# Patient Record
Sex: Female | Born: 1935 | Race: White | Hispanic: No | Marital: Married | Smoking: Former smoker
Health system: Southern US, Community
[De-identification: ages and names within clinical notes are randomized; demographics above are authoritative.]

## PROBLEM LIST (undated history)

## (undated) DIAGNOSIS — K219 Gastro-esophageal reflux disease without esophagitis: Secondary | ICD-10-CM

## (undated) DIAGNOSIS — M1990 Unspecified osteoarthritis, unspecified site: Secondary | ICD-10-CM

## (undated) DIAGNOSIS — E7800 Pure hypercholesterolemia, unspecified: Secondary | ICD-10-CM

## (undated) DIAGNOSIS — I10 Essential (primary) hypertension: Secondary | ICD-10-CM

## (undated) DIAGNOSIS — K5790 Diverticulosis of intestine, part unspecified, without perforation or abscess without bleeding: Secondary | ICD-10-CM

## (undated) DIAGNOSIS — E538 Deficiency of other specified B group vitamins: Secondary | ICD-10-CM

## (undated) DIAGNOSIS — M810 Age-related osteoporosis without current pathological fracture: Secondary | ICD-10-CM

## (undated) DIAGNOSIS — K589 Irritable bowel syndrome without diarrhea: Secondary | ICD-10-CM

## (undated) DIAGNOSIS — J189 Pneumonia, unspecified organism: Secondary | ICD-10-CM

## (undated) DIAGNOSIS — K635 Polyp of colon: Secondary | ICD-10-CM

## (undated) DIAGNOSIS — Z86010 Personal history of colonic polyps: Secondary | ICD-10-CM

## (undated) HISTORY — DX: Unspecified osteoarthritis, unspecified site: M19.90

## (undated) HISTORY — PX: CATARACT EXTRACTION, BILATERAL: SHX1313

## (undated) HISTORY — DX: Polyp of colon: K63.5

## (undated) HISTORY — PX: COLONOSCOPY WITH ESOPHAGOGASTRODUODENOSCOPY (EGD): SHX5779

## (undated) HISTORY — DX: Pneumonia, unspecified organism: J18.9

## (undated) HISTORY — DX: Diverticulosis of intestine, part unspecified, without perforation or abscess without bleeding: K57.90

## (undated) HISTORY — DX: Personal history of colonic polyps: Z86.010

## (undated) HISTORY — DX: Deficiency of other specified B group vitamins: E53.8

## (undated) HISTORY — PX: PARATHYROIDECTOMY: SHX19

---
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
## 1988-04-18 HISTORY — PX: NASAL SINUS SURGERY: SHX719

## 2013-04-18 HISTORY — PX: FEMUR FRACTURE SURGERY: SHX633

## 2014-01-16 HISTORY — PX: ESOPHAGOGASTRODUODENOSCOPY: SHX1529

## 2014-06-18 DIAGNOSIS — R1013 Epigastric pain: Secondary | ICD-10-CM | POA: Diagnosis not present

## 2014-06-18 DIAGNOSIS — K5900 Constipation, unspecified: Secondary | ICD-10-CM | POA: Diagnosis not present

## 2014-06-18 DIAGNOSIS — R1030 Lower abdominal pain, unspecified: Secondary | ICD-10-CM | POA: Diagnosis not present

## 2014-06-18 DIAGNOSIS — R140 Abdominal distension (gaseous): Secondary | ICD-10-CM | POA: Diagnosis not present

## 2014-06-18 DIAGNOSIS — K219 Gastro-esophageal reflux disease without esophagitis: Secondary | ICD-10-CM | POA: Diagnosis not present

## 2014-07-16 DIAGNOSIS — R140 Abdominal distension (gaseous): Secondary | ICD-10-CM | POA: Diagnosis not present

## 2014-07-16 DIAGNOSIS — K5900 Constipation, unspecified: Secondary | ICD-10-CM | POA: Diagnosis not present

## 2014-07-16 DIAGNOSIS — K219 Gastro-esophageal reflux disease without esophagitis: Secondary | ICD-10-CM | POA: Diagnosis not present

## 2014-07-16 DIAGNOSIS — R1013 Epigastric pain: Secondary | ICD-10-CM | POA: Diagnosis not present

## 2014-07-16 DIAGNOSIS — R1030 Lower abdominal pain, unspecified: Secondary | ICD-10-CM | POA: Diagnosis not present

## 2014-07-19 DIAGNOSIS — S72141A Displaced intertrochanteric fracture of right femur, initial encounter for closed fracture: Secondary | ICD-10-CM | POA: Insufficient documentation

## 2014-07-19 DIAGNOSIS — M79604 Pain in right leg: Secondary | ICD-10-CM | POA: Diagnosis not present

## 2014-07-19 DIAGNOSIS — K560 Paralytic ileus: Secondary | ICD-10-CM | POA: Diagnosis not present

## 2014-07-19 DIAGNOSIS — I10 Essential (primary) hypertension: Secondary | ICD-10-CM | POA: Diagnosis not present

## 2014-07-19 DIAGNOSIS — M810 Age-related osteoporosis without current pathological fracture: Secondary | ICD-10-CM | POA: Diagnosis not present

## 2014-07-19 DIAGNOSIS — K913 Postprocedural intestinal obstruction: Secondary | ICD-10-CM | POA: Diagnosis not present

## 2014-07-19 DIAGNOSIS — S72141D Displaced intertrochanteric fracture of right femur, subsequent encounter for closed fracture with routine healing: Secondary | ICD-10-CM | POA: Diagnosis not present

## 2014-07-19 DIAGNOSIS — M80051D Age-related osteoporosis with current pathological fracture, right femur, subsequent encounter for fracture with routine healing: Secondary | ICD-10-CM | POA: Diagnosis not present

## 2014-07-19 DIAGNOSIS — Z7901 Long term (current) use of anticoagulants: Secondary | ICD-10-CM | POA: Diagnosis not present

## 2014-07-19 DIAGNOSIS — E780 Pure hypercholesterolemia: Secondary | ICD-10-CM | POA: Diagnosis not present

## 2014-07-19 DIAGNOSIS — Z9181 History of falling: Secondary | ICD-10-CM | POA: Diagnosis not present

## 2014-07-19 DIAGNOSIS — K567 Ileus, unspecified: Secondary | ICD-10-CM | POA: Diagnosis not present

## 2014-07-19 DIAGNOSIS — R918 Other nonspecific abnormal finding of lung field: Secondary | ICD-10-CM | POA: Diagnosis not present

## 2014-07-19 DIAGNOSIS — S72001A Fracture of unspecified part of neck of right femur, initial encounter for closed fracture: Secondary | ICD-10-CM | POA: Diagnosis not present

## 2014-07-24 DIAGNOSIS — S72141A Displaced intertrochanteric fracture of right femur, initial encounter for closed fracture: Secondary | ICD-10-CM | POA: Diagnosis not present

## 2014-07-24 DIAGNOSIS — Z760 Encounter for issue of repeat prescription: Secondary | ICD-10-CM | POA: Diagnosis not present

## 2014-07-24 DIAGNOSIS — R262 Difficulty in walking, not elsewhere classified: Secondary | ICD-10-CM | POA: Diagnosis not present

## 2014-07-24 DIAGNOSIS — E559 Vitamin D deficiency, unspecified: Secondary | ICD-10-CM | POA: Diagnosis not present

## 2014-07-24 DIAGNOSIS — E785 Hyperlipidemia, unspecified: Secondary | ICD-10-CM | POA: Diagnosis not present

## 2014-07-24 DIAGNOSIS — M810 Age-related osteoporosis without current pathological fracture: Secondary | ICD-10-CM | POA: Diagnosis not present

## 2014-07-24 DIAGNOSIS — K913 Postprocedural intestinal obstruction: Secondary | ICD-10-CM | POA: Diagnosis not present

## 2014-07-24 DIAGNOSIS — R109 Unspecified abdominal pain: Secondary | ICD-10-CM | POA: Diagnosis not present

## 2014-07-24 DIAGNOSIS — S72001A Fracture of unspecified part of neck of right femur, initial encounter for closed fracture: Secondary | ICD-10-CM | POA: Diagnosis not present

## 2014-07-24 DIAGNOSIS — M80051D Age-related osteoporosis with current pathological fracture, right femur, subsequent encounter for fracture with routine healing: Secondary | ICD-10-CM | POA: Diagnosis not present

## 2014-07-24 DIAGNOSIS — K219 Gastro-esophageal reflux disease without esophagitis: Secondary | ICD-10-CM | POA: Diagnosis not present

## 2014-07-24 DIAGNOSIS — Z9181 History of falling: Secondary | ICD-10-CM | POA: Diagnosis not present

## 2014-07-24 DIAGNOSIS — M6281 Muscle weakness (generalized): Secondary | ICD-10-CM | POA: Diagnosis not present

## 2014-07-24 DIAGNOSIS — K5900 Constipation, unspecified: Secondary | ICD-10-CM | POA: Diagnosis not present

## 2014-07-24 DIAGNOSIS — G4700 Insomnia, unspecified: Secondary | ICD-10-CM | POA: Diagnosis not present

## 2014-07-24 DIAGNOSIS — Z7901 Long term (current) use of anticoagulants: Secondary | ICD-10-CM | POA: Diagnosis not present

## 2014-07-24 DIAGNOSIS — E780 Pure hypercholesterolemia: Secondary | ICD-10-CM | POA: Diagnosis not present

## 2014-07-24 DIAGNOSIS — D558 Other anemias due to enzyme disorders: Secondary | ICD-10-CM | POA: Diagnosis not present

## 2014-07-24 DIAGNOSIS — S72141D Displaced intertrochanteric fracture of right femur, subsequent encounter for closed fracture with routine healing: Secondary | ICD-10-CM | POA: Diagnosis not present

## 2014-07-24 DIAGNOSIS — I10 Essential (primary) hypertension: Secondary | ICD-10-CM | POA: Diagnosis not present

## 2014-07-24 DIAGNOSIS — D519 Vitamin B12 deficiency anemia, unspecified: Secondary | ICD-10-CM | POA: Diagnosis not present

## 2014-07-24 DIAGNOSIS — M25559 Pain in unspecified hip: Secondary | ICD-10-CM | POA: Diagnosis not present

## 2014-07-24 DIAGNOSIS — R682 Dry mouth, unspecified: Secondary | ICD-10-CM | POA: Diagnosis not present

## 2014-07-25 DIAGNOSIS — I10 Essential (primary) hypertension: Secondary | ICD-10-CM | POA: Diagnosis not present

## 2014-07-25 DIAGNOSIS — M810 Age-related osteoporosis without current pathological fracture: Secondary | ICD-10-CM | POA: Diagnosis not present

## 2014-07-25 DIAGNOSIS — R262 Difficulty in walking, not elsewhere classified: Secondary | ICD-10-CM | POA: Diagnosis not present

## 2014-07-25 DIAGNOSIS — E785 Hyperlipidemia, unspecified: Secondary | ICD-10-CM | POA: Diagnosis not present

## 2014-07-27 DIAGNOSIS — M6281 Muscle weakness (generalized): Secondary | ICD-10-CM | POA: Diagnosis not present

## 2014-07-27 DIAGNOSIS — M25559 Pain in unspecified hip: Secondary | ICD-10-CM | POA: Diagnosis not present

## 2014-07-29 DIAGNOSIS — D519 Vitamin B12 deficiency anemia, unspecified: Secondary | ICD-10-CM | POA: Diagnosis not present

## 2014-07-29 DIAGNOSIS — E559 Vitamin D deficiency, unspecified: Secondary | ICD-10-CM | POA: Diagnosis not present

## 2014-07-30 DIAGNOSIS — R262 Difficulty in walking, not elsewhere classified: Secondary | ICD-10-CM | POA: Diagnosis not present

## 2014-07-30 DIAGNOSIS — R682 Dry mouth, unspecified: Secondary | ICD-10-CM | POA: Diagnosis not present

## 2014-08-02 DIAGNOSIS — R262 Difficulty in walking, not elsewhere classified: Secondary | ICD-10-CM | POA: Diagnosis not present

## 2014-08-02 DIAGNOSIS — G4700 Insomnia, unspecified: Secondary | ICD-10-CM | POA: Diagnosis not present

## 2014-08-07 DIAGNOSIS — E785 Hyperlipidemia, unspecified: Secondary | ICD-10-CM | POA: Diagnosis not present

## 2014-08-07 DIAGNOSIS — K219 Gastro-esophageal reflux disease without esophagitis: Secondary | ICD-10-CM | POA: Diagnosis not present

## 2014-08-07 DIAGNOSIS — D558 Other anemias due to enzyme disorders: Secondary | ICD-10-CM | POA: Diagnosis not present

## 2014-08-07 DIAGNOSIS — I10 Essential (primary) hypertension: Secondary | ICD-10-CM | POA: Diagnosis not present

## 2014-08-10 DIAGNOSIS — D558 Other anemias due to enzyme disorders: Secondary | ICD-10-CM | POA: Diagnosis not present

## 2014-08-10 DIAGNOSIS — R262 Difficulty in walking, not elsewhere classified: Secondary | ICD-10-CM | POA: Diagnosis not present

## 2014-08-14 DIAGNOSIS — S72141D Displaced intertrochanteric fracture of right femur, subsequent encounter for closed fracture with routine healing: Secondary | ICD-10-CM | POA: Diagnosis not present

## 2014-08-19 DIAGNOSIS — M810 Age-related osteoporosis without current pathological fracture: Secondary | ICD-10-CM | POA: Diagnosis not present

## 2014-08-19 DIAGNOSIS — I10 Essential (primary) hypertension: Secondary | ICD-10-CM | POA: Diagnosis not present

## 2014-08-19 DIAGNOSIS — Z760 Encounter for issue of repeat prescription: Secondary | ICD-10-CM | POA: Diagnosis not present

## 2014-08-19 DIAGNOSIS — K219 Gastro-esophageal reflux disease without esophagitis: Secondary | ICD-10-CM | POA: Diagnosis not present

## 2014-08-20 DIAGNOSIS — K5900 Constipation, unspecified: Secondary | ICD-10-CM | POA: Diagnosis not present

## 2014-08-20 DIAGNOSIS — R262 Difficulty in walking, not elsewhere classified: Secondary | ICD-10-CM | POA: Diagnosis not present

## 2014-08-27 DIAGNOSIS — D558 Other anemias due to enzyme disorders: Secondary | ICD-10-CM | POA: Diagnosis not present

## 2014-08-27 DIAGNOSIS — R262 Difficulty in walking, not elsewhere classified: Secondary | ICD-10-CM | POA: Diagnosis not present

## 2014-08-27 DIAGNOSIS — K5900 Constipation, unspecified: Secondary | ICD-10-CM | POA: Diagnosis not present

## 2014-09-08 DIAGNOSIS — S72141D Displaced intertrochanteric fracture of right femur, subsequent encounter for closed fracture with routine healing: Secondary | ICD-10-CM | POA: Diagnosis not present

## 2014-09-08 DIAGNOSIS — K219 Gastro-esophageal reflux disease without esophagitis: Secondary | ICD-10-CM | POA: Diagnosis not present

## 2014-09-08 DIAGNOSIS — I10 Essential (primary) hypertension: Secondary | ICD-10-CM | POA: Diagnosis not present

## 2014-09-08 DIAGNOSIS — M810 Age-related osteoporosis without current pathological fracture: Secondary | ICD-10-CM | POA: Diagnosis not present

## 2014-09-08 DIAGNOSIS — E780 Pure hypercholesterolemia: Secondary | ICD-10-CM | POA: Diagnosis not present

## 2014-09-08 DIAGNOSIS — K589 Irritable bowel syndrome without diarrhea: Secondary | ICD-10-CM | POA: Diagnosis not present

## 2014-09-09 DIAGNOSIS — M810 Age-related osteoporosis without current pathological fracture: Secondary | ICD-10-CM | POA: Diagnosis not present

## 2014-09-09 DIAGNOSIS — S72141D Displaced intertrochanteric fracture of right femur, subsequent encounter for closed fracture with routine healing: Secondary | ICD-10-CM | POA: Diagnosis not present

## 2014-09-09 DIAGNOSIS — I10 Essential (primary) hypertension: Secondary | ICD-10-CM | POA: Diagnosis not present

## 2014-09-09 DIAGNOSIS — E780 Pure hypercholesterolemia: Secondary | ICD-10-CM | POA: Diagnosis not present

## 2014-09-09 DIAGNOSIS — K219 Gastro-esophageal reflux disease without esophagitis: Secondary | ICD-10-CM | POA: Diagnosis not present

## 2014-09-09 DIAGNOSIS — K589 Irritable bowel syndrome without diarrhea: Secondary | ICD-10-CM | POA: Diagnosis not present

## 2014-09-10 DIAGNOSIS — I10 Essential (primary) hypertension: Secondary | ICD-10-CM | POA: Diagnosis not present

## 2014-09-10 DIAGNOSIS — K589 Irritable bowel syndrome without diarrhea: Secondary | ICD-10-CM | POA: Diagnosis not present

## 2014-09-10 DIAGNOSIS — K219 Gastro-esophageal reflux disease without esophagitis: Secondary | ICD-10-CM | POA: Diagnosis not present

## 2014-09-10 DIAGNOSIS — S72141D Displaced intertrochanteric fracture of right femur, subsequent encounter for closed fracture with routine healing: Secondary | ICD-10-CM | POA: Diagnosis not present

## 2014-09-10 DIAGNOSIS — E780 Pure hypercholesterolemia: Secondary | ICD-10-CM | POA: Diagnosis not present

## 2014-09-10 DIAGNOSIS — M810 Age-related osteoporosis without current pathological fracture: Secondary | ICD-10-CM | POA: Diagnosis not present

## 2014-09-11 DIAGNOSIS — S72141D Displaced intertrochanteric fracture of right femur, subsequent encounter for closed fracture with routine healing: Secondary | ICD-10-CM | POA: Diagnosis not present

## 2014-09-12 DIAGNOSIS — S72141D Displaced intertrochanteric fracture of right femur, subsequent encounter for closed fracture with routine healing: Secondary | ICD-10-CM | POA: Diagnosis not present

## 2014-09-12 DIAGNOSIS — K589 Irritable bowel syndrome without diarrhea: Secondary | ICD-10-CM | POA: Diagnosis not present

## 2014-09-12 DIAGNOSIS — I10 Essential (primary) hypertension: Secondary | ICD-10-CM | POA: Diagnosis not present

## 2014-09-12 DIAGNOSIS — K219 Gastro-esophageal reflux disease without esophagitis: Secondary | ICD-10-CM | POA: Diagnosis not present

## 2014-09-12 DIAGNOSIS — M810 Age-related osteoporosis without current pathological fracture: Secondary | ICD-10-CM | POA: Diagnosis not present

## 2014-09-12 DIAGNOSIS — E780 Pure hypercholesterolemia: Secondary | ICD-10-CM | POA: Diagnosis not present

## 2014-09-15 DIAGNOSIS — K589 Irritable bowel syndrome without diarrhea: Secondary | ICD-10-CM | POA: Diagnosis not present

## 2014-09-15 DIAGNOSIS — E780 Pure hypercholesterolemia: Secondary | ICD-10-CM | POA: Diagnosis not present

## 2014-09-15 DIAGNOSIS — S72141D Displaced intertrochanteric fracture of right femur, subsequent encounter for closed fracture with routine healing: Secondary | ICD-10-CM | POA: Diagnosis not present

## 2014-09-15 DIAGNOSIS — I10 Essential (primary) hypertension: Secondary | ICD-10-CM | POA: Diagnosis not present

## 2014-09-15 DIAGNOSIS — M810 Age-related osteoporosis without current pathological fracture: Secondary | ICD-10-CM | POA: Diagnosis not present

## 2014-09-15 DIAGNOSIS — K219 Gastro-esophageal reflux disease without esophagitis: Secondary | ICD-10-CM | POA: Diagnosis not present

## 2014-09-17 DIAGNOSIS — S72141D Displaced intertrochanteric fracture of right femur, subsequent encounter for closed fracture with routine healing: Secondary | ICD-10-CM | POA: Diagnosis not present

## 2014-09-17 DIAGNOSIS — M810 Age-related osteoporosis without current pathological fracture: Secondary | ICD-10-CM | POA: Diagnosis not present

## 2014-09-17 DIAGNOSIS — E780 Pure hypercholesterolemia: Secondary | ICD-10-CM | POA: Diagnosis not present

## 2014-09-17 DIAGNOSIS — K219 Gastro-esophageal reflux disease without esophagitis: Secondary | ICD-10-CM | POA: Diagnosis not present

## 2014-09-17 DIAGNOSIS — I10 Essential (primary) hypertension: Secondary | ICD-10-CM | POA: Diagnosis not present

## 2014-09-17 DIAGNOSIS — K589 Irritable bowel syndrome without diarrhea: Secondary | ICD-10-CM | POA: Diagnosis not present

## 2014-09-18 DIAGNOSIS — S72141D Displaced intertrochanteric fracture of right femur, subsequent encounter for closed fracture with routine healing: Secondary | ICD-10-CM | POA: Diagnosis not present

## 2014-09-18 DIAGNOSIS — I10 Essential (primary) hypertension: Secondary | ICD-10-CM | POA: Diagnosis not present

## 2014-09-18 DIAGNOSIS — M810 Age-related osteoporosis without current pathological fracture: Secondary | ICD-10-CM | POA: Diagnosis not present

## 2014-09-18 DIAGNOSIS — K219 Gastro-esophageal reflux disease without esophagitis: Secondary | ICD-10-CM | POA: Diagnosis not present

## 2014-09-18 DIAGNOSIS — K589 Irritable bowel syndrome without diarrhea: Secondary | ICD-10-CM | POA: Diagnosis not present

## 2014-09-18 DIAGNOSIS — E780 Pure hypercholesterolemia: Secondary | ICD-10-CM | POA: Diagnosis not present

## 2014-09-22 DIAGNOSIS — K219 Gastro-esophageal reflux disease without esophagitis: Secondary | ICD-10-CM | POA: Diagnosis not present

## 2014-09-22 DIAGNOSIS — S72141D Displaced intertrochanteric fracture of right femur, subsequent encounter for closed fracture with routine healing: Secondary | ICD-10-CM | POA: Diagnosis not present

## 2014-09-22 DIAGNOSIS — E780 Pure hypercholesterolemia: Secondary | ICD-10-CM | POA: Diagnosis not present

## 2014-09-22 DIAGNOSIS — M810 Age-related osteoporosis without current pathological fracture: Secondary | ICD-10-CM | POA: Diagnosis not present

## 2014-09-22 DIAGNOSIS — I10 Essential (primary) hypertension: Secondary | ICD-10-CM | POA: Diagnosis not present

## 2014-09-22 DIAGNOSIS — K589 Irritable bowel syndrome without diarrhea: Secondary | ICD-10-CM | POA: Diagnosis not present

## 2014-09-23 DIAGNOSIS — I10 Essential (primary) hypertension: Secondary | ICD-10-CM | POA: Diagnosis not present

## 2014-09-23 DIAGNOSIS — S72141D Displaced intertrochanteric fracture of right femur, subsequent encounter for closed fracture with routine healing: Secondary | ICD-10-CM | POA: Diagnosis not present

## 2014-09-23 DIAGNOSIS — K589 Irritable bowel syndrome without diarrhea: Secondary | ICD-10-CM | POA: Diagnosis not present

## 2014-09-23 DIAGNOSIS — K219 Gastro-esophageal reflux disease without esophagitis: Secondary | ICD-10-CM | POA: Diagnosis not present

## 2014-09-23 DIAGNOSIS — E780 Pure hypercholesterolemia: Secondary | ICD-10-CM | POA: Diagnosis not present

## 2014-09-23 DIAGNOSIS — M810 Age-related osteoporosis without current pathological fracture: Secondary | ICD-10-CM | POA: Diagnosis not present

## 2014-09-24 DIAGNOSIS — I10 Essential (primary) hypertension: Secondary | ICD-10-CM | POA: Diagnosis not present

## 2014-09-24 DIAGNOSIS — M810 Age-related osteoporosis without current pathological fracture: Secondary | ICD-10-CM | POA: Diagnosis not present

## 2014-09-24 DIAGNOSIS — K219 Gastro-esophageal reflux disease without esophagitis: Secondary | ICD-10-CM | POA: Diagnosis not present

## 2014-09-24 DIAGNOSIS — S72141D Displaced intertrochanteric fracture of right femur, subsequent encounter for closed fracture with routine healing: Secondary | ICD-10-CM | POA: Diagnosis not present

## 2014-09-24 DIAGNOSIS — K589 Irritable bowel syndrome without diarrhea: Secondary | ICD-10-CM | POA: Diagnosis not present

## 2014-09-24 DIAGNOSIS — E780 Pure hypercholesterolemia: Secondary | ICD-10-CM | POA: Diagnosis not present

## 2014-09-25 DIAGNOSIS — K589 Irritable bowel syndrome without diarrhea: Secondary | ICD-10-CM | POA: Diagnosis not present

## 2014-09-25 DIAGNOSIS — E559 Vitamin D deficiency, unspecified: Secondary | ICD-10-CM | POA: Diagnosis not present

## 2014-09-25 DIAGNOSIS — E210 Primary hyperparathyroidism: Secondary | ICD-10-CM | POA: Diagnosis not present

## 2014-09-25 DIAGNOSIS — I10 Essential (primary) hypertension: Secondary | ICD-10-CM | POA: Diagnosis not present

## 2014-09-25 DIAGNOSIS — E780 Pure hypercholesterolemia: Secondary | ICD-10-CM | POA: Diagnosis not present

## 2014-09-25 DIAGNOSIS — M810 Age-related osteoporosis without current pathological fracture: Secondary | ICD-10-CM | POA: Diagnosis not present

## 2014-09-26 DIAGNOSIS — K589 Irritable bowel syndrome without diarrhea: Secondary | ICD-10-CM | POA: Diagnosis not present

## 2014-09-26 DIAGNOSIS — I10 Essential (primary) hypertension: Secondary | ICD-10-CM | POA: Diagnosis not present

## 2014-09-26 DIAGNOSIS — E780 Pure hypercholesterolemia: Secondary | ICD-10-CM | POA: Diagnosis not present

## 2014-09-26 DIAGNOSIS — S72141D Displaced intertrochanteric fracture of right femur, subsequent encounter for closed fracture with routine healing: Secondary | ICD-10-CM | POA: Diagnosis not present

## 2014-09-26 DIAGNOSIS — M810 Age-related osteoporosis without current pathological fracture: Secondary | ICD-10-CM | POA: Diagnosis not present

## 2014-09-26 DIAGNOSIS — K219 Gastro-esophageal reflux disease without esophagitis: Secondary | ICD-10-CM | POA: Diagnosis not present

## 2014-09-29 DIAGNOSIS — E780 Pure hypercholesterolemia: Secondary | ICD-10-CM | POA: Diagnosis not present

## 2014-09-29 DIAGNOSIS — K589 Irritable bowel syndrome without diarrhea: Secondary | ICD-10-CM | POA: Diagnosis not present

## 2014-09-29 DIAGNOSIS — S72141D Displaced intertrochanteric fracture of right femur, subsequent encounter for closed fracture with routine healing: Secondary | ICD-10-CM | POA: Diagnosis not present

## 2014-09-29 DIAGNOSIS — K219 Gastro-esophageal reflux disease without esophagitis: Secondary | ICD-10-CM | POA: Diagnosis not present

## 2014-09-29 DIAGNOSIS — M810 Age-related osteoporosis without current pathological fracture: Secondary | ICD-10-CM | POA: Diagnosis not present

## 2014-09-29 DIAGNOSIS — I10 Essential (primary) hypertension: Secondary | ICD-10-CM | POA: Diagnosis not present

## 2014-10-01 DIAGNOSIS — E780 Pure hypercholesterolemia: Secondary | ICD-10-CM | POA: Diagnosis not present

## 2014-10-01 DIAGNOSIS — K589 Irritable bowel syndrome without diarrhea: Secondary | ICD-10-CM | POA: Diagnosis not present

## 2014-10-01 DIAGNOSIS — M810 Age-related osteoporosis without current pathological fracture: Secondary | ICD-10-CM | POA: Diagnosis not present

## 2014-10-01 DIAGNOSIS — K219 Gastro-esophageal reflux disease without esophagitis: Secondary | ICD-10-CM | POA: Diagnosis not present

## 2014-10-01 DIAGNOSIS — I10 Essential (primary) hypertension: Secondary | ICD-10-CM | POA: Diagnosis not present

## 2014-10-01 DIAGNOSIS — S72141D Displaced intertrochanteric fracture of right femur, subsequent encounter for closed fracture with routine healing: Secondary | ICD-10-CM | POA: Diagnosis not present

## 2014-10-02 DIAGNOSIS — K589 Irritable bowel syndrome without diarrhea: Secondary | ICD-10-CM | POA: Diagnosis not present

## 2014-10-02 DIAGNOSIS — S72141D Displaced intertrochanteric fracture of right femur, subsequent encounter for closed fracture with routine healing: Secondary | ICD-10-CM | POA: Diagnosis not present

## 2014-10-02 DIAGNOSIS — M810 Age-related osteoporosis without current pathological fracture: Secondary | ICD-10-CM | POA: Diagnosis not present

## 2014-10-02 DIAGNOSIS — I10 Essential (primary) hypertension: Secondary | ICD-10-CM | POA: Diagnosis not present

## 2014-10-02 DIAGNOSIS — E780 Pure hypercholesterolemia: Secondary | ICD-10-CM | POA: Diagnosis not present

## 2014-10-02 DIAGNOSIS — K219 Gastro-esophageal reflux disease without esophagitis: Secondary | ICD-10-CM | POA: Diagnosis not present

## 2014-10-06 DIAGNOSIS — I10 Essential (primary) hypertension: Secondary | ICD-10-CM | POA: Diagnosis not present

## 2014-10-06 DIAGNOSIS — S72141D Displaced intertrochanteric fracture of right femur, subsequent encounter for closed fracture with routine healing: Secondary | ICD-10-CM | POA: Diagnosis not present

## 2014-10-06 DIAGNOSIS — M810 Age-related osteoporosis without current pathological fracture: Secondary | ICD-10-CM | POA: Diagnosis not present

## 2014-10-06 DIAGNOSIS — E780 Pure hypercholesterolemia: Secondary | ICD-10-CM | POA: Diagnosis not present

## 2014-10-06 DIAGNOSIS — K589 Irritable bowel syndrome without diarrhea: Secondary | ICD-10-CM | POA: Diagnosis not present

## 2014-10-06 DIAGNOSIS — K219 Gastro-esophageal reflux disease without esophagitis: Secondary | ICD-10-CM | POA: Diagnosis not present

## 2014-10-08 DIAGNOSIS — M810 Age-related osteoporosis without current pathological fracture: Secondary | ICD-10-CM | POA: Diagnosis not present

## 2014-10-08 DIAGNOSIS — K589 Irritable bowel syndrome without diarrhea: Secondary | ICD-10-CM | POA: Diagnosis not present

## 2014-10-08 DIAGNOSIS — I10 Essential (primary) hypertension: Secondary | ICD-10-CM | POA: Diagnosis not present

## 2014-10-08 DIAGNOSIS — S72141D Displaced intertrochanteric fracture of right femur, subsequent encounter for closed fracture with routine healing: Secondary | ICD-10-CM | POA: Diagnosis not present

## 2014-10-08 DIAGNOSIS — E780 Pure hypercholesterolemia: Secondary | ICD-10-CM | POA: Diagnosis not present

## 2014-10-08 DIAGNOSIS — K219 Gastro-esophageal reflux disease without esophagitis: Secondary | ICD-10-CM | POA: Diagnosis not present

## 2014-10-09 DIAGNOSIS — E780 Pure hypercholesterolemia: Secondary | ICD-10-CM | POA: Diagnosis not present

## 2014-10-09 DIAGNOSIS — M810 Age-related osteoporosis without current pathological fracture: Secondary | ICD-10-CM | POA: Diagnosis not present

## 2014-10-09 DIAGNOSIS — K589 Irritable bowel syndrome without diarrhea: Secondary | ICD-10-CM | POA: Diagnosis not present

## 2014-10-09 DIAGNOSIS — S72141D Displaced intertrochanteric fracture of right femur, subsequent encounter for closed fracture with routine healing: Secondary | ICD-10-CM | POA: Diagnosis not present

## 2014-10-09 DIAGNOSIS — K219 Gastro-esophageal reflux disease without esophagitis: Secondary | ICD-10-CM | POA: Diagnosis not present

## 2014-10-09 DIAGNOSIS — I10 Essential (primary) hypertension: Secondary | ICD-10-CM | POA: Diagnosis not present

## 2014-10-14 DIAGNOSIS — M810 Age-related osteoporosis without current pathological fracture: Secondary | ICD-10-CM | POA: Diagnosis not present

## 2014-10-14 DIAGNOSIS — E780 Pure hypercholesterolemia: Secondary | ICD-10-CM | POA: Diagnosis not present

## 2014-10-14 DIAGNOSIS — I10 Essential (primary) hypertension: Secondary | ICD-10-CM | POA: Diagnosis not present

## 2014-10-14 DIAGNOSIS — S72141D Displaced intertrochanteric fracture of right femur, subsequent encounter for closed fracture with routine healing: Secondary | ICD-10-CM | POA: Diagnosis not present

## 2014-10-14 DIAGNOSIS — K219 Gastro-esophageal reflux disease without esophagitis: Secondary | ICD-10-CM | POA: Diagnosis not present

## 2014-10-14 DIAGNOSIS — K589 Irritable bowel syndrome without diarrhea: Secondary | ICD-10-CM | POA: Diagnosis not present

## 2014-10-16 DIAGNOSIS — I10 Essential (primary) hypertension: Secondary | ICD-10-CM | POA: Diagnosis not present

## 2014-10-16 DIAGNOSIS — S72141D Displaced intertrochanteric fracture of right femur, subsequent encounter for closed fracture with routine healing: Secondary | ICD-10-CM | POA: Diagnosis not present

## 2014-10-16 DIAGNOSIS — M810 Age-related osteoporosis without current pathological fracture: Secondary | ICD-10-CM | POA: Diagnosis not present

## 2014-10-16 DIAGNOSIS — E780 Pure hypercholesterolemia: Secondary | ICD-10-CM | POA: Diagnosis not present

## 2014-10-16 DIAGNOSIS — K219 Gastro-esophageal reflux disease without esophagitis: Secondary | ICD-10-CM | POA: Diagnosis not present

## 2014-10-16 DIAGNOSIS — E210 Primary hyperparathyroidism: Secondary | ICD-10-CM | POA: Diagnosis not present

## 2014-10-16 DIAGNOSIS — K589 Irritable bowel syndrome without diarrhea: Secondary | ICD-10-CM | POA: Diagnosis not present

## 2014-10-21 DIAGNOSIS — M810 Age-related osteoporosis without current pathological fracture: Secondary | ICD-10-CM | POA: Diagnosis not present

## 2014-10-21 DIAGNOSIS — I10 Essential (primary) hypertension: Secondary | ICD-10-CM | POA: Diagnosis not present

## 2014-10-21 DIAGNOSIS — K589 Irritable bowel syndrome without diarrhea: Secondary | ICD-10-CM | POA: Diagnosis not present

## 2014-10-21 DIAGNOSIS — E780 Pure hypercholesterolemia: Secondary | ICD-10-CM | POA: Diagnosis not present

## 2014-10-21 DIAGNOSIS — K219 Gastro-esophageal reflux disease without esophagitis: Secondary | ICD-10-CM | POA: Diagnosis not present

## 2014-10-21 DIAGNOSIS — S72141D Displaced intertrochanteric fracture of right femur, subsequent encounter for closed fracture with routine healing: Secondary | ICD-10-CM | POA: Diagnosis not present

## 2014-10-23 DIAGNOSIS — S72141D Displaced intertrochanteric fracture of right femur, subsequent encounter for closed fracture with routine healing: Secondary | ICD-10-CM | POA: Diagnosis not present

## 2014-10-23 DIAGNOSIS — E780 Pure hypercholesterolemia: Secondary | ICD-10-CM | POA: Diagnosis not present

## 2014-10-23 DIAGNOSIS — E559 Vitamin D deficiency, unspecified: Secondary | ICD-10-CM | POA: Diagnosis not present

## 2014-10-23 DIAGNOSIS — Z0000 Encounter for general adult medical examination without abnormal findings: Secondary | ICD-10-CM | POA: Diagnosis not present

## 2014-10-23 DIAGNOSIS — K589 Irritable bowel syndrome without diarrhea: Secondary | ICD-10-CM | POA: Diagnosis not present

## 2014-10-23 DIAGNOSIS — E210 Primary hyperparathyroidism: Secondary | ICD-10-CM | POA: Diagnosis not present

## 2014-10-23 DIAGNOSIS — Z23 Encounter for immunization: Secondary | ICD-10-CM | POA: Diagnosis not present

## 2014-10-23 DIAGNOSIS — I10 Essential (primary) hypertension: Secondary | ICD-10-CM | POA: Diagnosis not present

## 2014-10-23 DIAGNOSIS — M810 Age-related osteoporosis without current pathological fracture: Secondary | ICD-10-CM | POA: Diagnosis not present

## 2014-10-23 DIAGNOSIS — K219 Gastro-esophageal reflux disease without esophagitis: Secondary | ICD-10-CM | POA: Diagnosis not present

## 2014-11-13 DIAGNOSIS — S72141D Displaced intertrochanteric fracture of right femur, subsequent encounter for closed fracture with routine healing: Secondary | ICD-10-CM | POA: Diagnosis not present

## 2014-12-09 DIAGNOSIS — Z780 Asymptomatic menopausal state: Secondary | ICD-10-CM | POA: Diagnosis not present

## 2014-12-09 DIAGNOSIS — Z1231 Encounter for screening mammogram for malignant neoplasm of breast: Secondary | ICD-10-CM | POA: Diagnosis not present

## 2014-12-09 DIAGNOSIS — E210 Primary hyperparathyroidism: Secondary | ICD-10-CM | POA: Diagnosis not present

## 2014-12-09 DIAGNOSIS — M810 Age-related osteoporosis without current pathological fracture: Secondary | ICD-10-CM | POA: Diagnosis not present

## 2014-12-09 DIAGNOSIS — Z7983 Long term (current) use of bisphosphonates: Secondary | ICD-10-CM | POA: Diagnosis not present

## 2015-01-04 DIAGNOSIS — Z23 Encounter for immunization: Secondary | ICD-10-CM | POA: Diagnosis not present

## 2015-04-02 ENCOUNTER — Emergency Department (HOSPITAL_BASED_OUTPATIENT_CLINIC_OR_DEPARTMENT_OTHER)
Admission: EM | Admit: 2015-04-02 | Discharge: 2015-04-02 | Disposition: A | Discharge status: Home / Self Care | Payer: Medicare Other | Attending: Emergency Medicine | Admitting: Emergency Medicine

## 2015-04-02 ENCOUNTER — Encounter (HOSPITAL_BASED_OUTPATIENT_CLINIC_OR_DEPARTMENT_OTHER): Payer: Self-pay | Admitting: *Deleted

## 2015-04-02 ENCOUNTER — Emergency Department (HOSPITAL_BASED_OUTPATIENT_CLINIC_OR_DEPARTMENT_OTHER): Payer: Medicare Other

## 2015-04-02 DIAGNOSIS — K219 Gastro-esophageal reflux disease without esophagitis: Secondary | ICD-10-CM | POA: Diagnosis not present

## 2015-04-02 DIAGNOSIS — E7800 Pure hypercholesterolemia, unspecified: Secondary | ICD-10-CM | POA: Diagnosis not present

## 2015-04-02 DIAGNOSIS — K589 Irritable bowel syndrome without diarrhea: Secondary | ICD-10-CM | POA: Diagnosis not present

## 2015-04-02 DIAGNOSIS — R05 Cough: Secondary | ICD-10-CM | POA: Diagnosis not present

## 2015-04-02 DIAGNOSIS — Z79899 Other long term (current) drug therapy: Secondary | ICD-10-CM | POA: Insufficient documentation

## 2015-04-02 DIAGNOSIS — J069 Acute upper respiratory infection, unspecified: Secondary | ICD-10-CM

## 2015-04-02 DIAGNOSIS — Z7982 Long term (current) use of aspirin: Secondary | ICD-10-CM | POA: Diagnosis not present

## 2015-04-02 DIAGNOSIS — Z8739 Personal history of other diseases of the musculoskeletal system and connective tissue: Secondary | ICD-10-CM | POA: Diagnosis not present

## 2015-04-02 DIAGNOSIS — R059 Cough, unspecified: Secondary | ICD-10-CM

## 2015-04-02 DIAGNOSIS — I10 Essential (primary) hypertension: Secondary | ICD-10-CM | POA: Insufficient documentation

## 2015-04-02 DIAGNOSIS — R0602 Shortness of breath: Secondary | ICD-10-CM | POA: Diagnosis not present

## 2015-04-02 DIAGNOSIS — R6889 Other general symptoms and signs: Secondary | ICD-10-CM | POA: Diagnosis not present

## 2015-04-02 HISTORY — DX: Pure hypercholesterolemia, unspecified: E78.00

## 2015-04-02 HISTORY — DX: Age-related osteoporosis without current pathological fracture: M81.0

## 2015-04-02 HISTORY — DX: Essential (primary) hypertension: I10

## 2015-04-02 HISTORY — DX: Gastro-esophageal reflux disease without esophagitis: K21.9

## 2015-04-02 HISTORY — DX: Irritable bowel syndrome without diarrhea: K58.9

## 2015-04-02 MED ORDER — BENZONATATE 100 MG PO CAPS: 100.0000 mg | ORAL_CAPSULE | Freq: Three times a day (TID) | ORAL | Status: DC | PRN

## 2015-04-02 MED ORDER — GUAIFENESIN 100 MG/5ML PO LIQD: 100.0000 mg | ORAL | Status: DC | PRN

## 2015-04-02 NOTE — ED Provider Notes (Signed)
CSN: [id number]     Arrival date & time 04/02/15  [id number] History   First MD Initiated Contact with Patient 04/02/15 [phone]     Chief Complaint  Patient presents with  . Cough     (Consider location/radiation/quality/duration/timing/severity/associated sxs/prior Treatment) HPI  81 year old female presents with worsening cough and congestion. Started with a "head cold" one week ago. Had sinus pressure, sneezing, and nasal congestion. Over the last 3 days is having a cough with occasional clear sputum but for the most part is dry. Feels occasional shortness of breath. No real chest pain. Chronic leg swelling that is not worse than typical. Denies any prior lung problems. Has not no any fevers. Denies sore throat or ear problems. Denies a history of smoking. She now feels like the congestion is in her chest. She has been taking Cold-ease, no relief.  Past Medical History  Diagnosis Date  . GERD (gastroesophageal reflux disease)   . Hypertension   . IBS (irritable bowel syndrome)   . High cholesterol   . Osteoporosis    Past Surgical History  Procedure Laterality Date  . Hip surgery     No family history on file. Social History  Substance Use Topics  . Smoking status: Never Smoker   . Smokeless tobacco: None  . Alcohol Use: None   OB History    No data available     Review of Systems  Constitutional: Negative for fever.  HENT: Positive for congestion and sneezing. Negative for sore throat.   Respiratory: Positive for cough and shortness of breath.   Gastrointestinal: Negative for vomiting.  All other systems reviewed and are negative.     Allergies  Sulfa antibiotics  Home Medications   Prior to Admission medications   Medication Sig Start Date End Date Taking? Authorizing Provider  aspirin 81 MG tablet Take 81 mg by mouth daily.   Yes Historical Provider, MD  atenolol (TENORMIN) 50 MG tablet Take 50 mg by mouth daily.   Yes Historical Provider, MD  calcium-vitamin D  (OSCAL WITH D) 500-200 MG-UNIT tablet Take 1 tablet by mouth.   Yes Historical Provider, MD  docusate sodium (COLACE) 100 MG capsule Take 100 mg by mouth 2 (two) times daily.   Yes Historical Provider, MD  esomeprazole (NEXIUM) 20 MG capsule Take 40 mg by mouth daily at 12 noon.   Yes Historical Provider, MD  hydrochlorothiazide (HYDRODIURIL) 25 MG tablet Take 25 mg by mouth daily.   Yes Historical Provider, MD  hyoscyamine (LEVBID) 0.375 MG 12 hr tablet Take 0.375 mg by mouth 2 (two) times daily.   Yes Historical Provider, MD  polyethylene glycol (MIRALAX / GLYCOLAX) packet Take 17 g by mouth daily.   Yes Historical Provider, MD  simvastatin (ZOCOR) 40 MG tablet Take 40 mg by mouth daily.   Yes Historical Provider, MD   BP 149/99 mmHg  Pulse 88  Temp(Src) 98.9 F (37.2 C) (Oral)  Resp 18  SpO2 97% Physical Exam  Constitutional: She is oriented to person, place, and time. She appears well-developed and well-nourished.  HENT:  Head: Normocephalic and atraumatic.  Right Ear: External ear normal.  Left Ear: External ear normal.  Nose: Nose normal.  Eyes: Right eye exhibits no discharge. Left eye exhibits no discharge.  Cardiovascular: Normal rate, regular rhythm and normal heart sounds.   Pulmonary/Chest: Effort normal and breath sounds normal. She has no wheezes. She has no rales.  No increased work of breathing, speaks in clear, complete sentences.  Abdominal:  Soft. There is no tenderness.  Musculoskeletal: She exhibits no edema.  Neurological: She is alert and oriented to person, place, and time.  Skin: Skin is warm and dry.  Nursing note and vitals reviewed.   ED Course  Procedures (including critical care time) Labs Review Labs Reviewed - No data to display  Imaging Review Dg Chest 2 View  04/02/2015  CLINICAL DATA:  Cough and shortness of breath for 1 week. EXAM: CHEST  2 VIEW COMPARISON:  None. FINDINGS: Heart size is normal. Mild tortuosity of thoracic aorta noted. No  evidence of pulmonary infiltrate or edema. No evidence of pleural effusion or pneumothorax. IMPRESSION: No active cardiopulmonary disease. Electronically Signed   By: Earle Gell M.D.   On: 04/02/2015 08:18   I have personally reviewed and evaluated these images and lab results as part of my medical decision-making.   EKG Interpretation None      MDM   Final diagnoses:  Cough  Upper respiratory infection    Patient appears well here, has symptoms consistent with a viral upper respiratory infection. Is afebrile, no increased work of breathing and normal oxygen saturations. No wheezing or obvious bronchospasm or rales heard. No indication for antibiotics, will treat with cough suppressants and expectorants and discussed symptomatic care. Discussed plan to follow-up with PCP early next week and to return if any symptoms worsen.    Sherwood Gambler, MD 04/02/15 [phone]

## 2015-04-02 NOTE — ED Notes (Signed)
Cold and head congestion x 1 week. Cough with clear sputum.

## 2015-04-27 ENCOUNTER — Ambulatory Visit: Payer: Medicare Other | Admitting: Family

## 2015-06-04 ENCOUNTER — Telehealth: Payer: Self-pay | Admitting: Behavioral Health

## 2015-06-04 NOTE — Telephone Encounter (Signed)
Unable to reach patient at time of Pre-Visit Call.  Left message for patient to return call when available.    

## 2015-06-05 ENCOUNTER — Ambulatory Visit (INDEPENDENT_AMBULATORY_CARE_PROVIDER_SITE_OTHER): Payer: Medicare Other | Admitting: Family

## 2015-06-05 ENCOUNTER — Encounter: Payer: Self-pay | Admitting: Family

## 2015-06-05 VITALS — BP 130/86 | HR 73 | Temp 97.8°F | Resp 16 | Ht 66.0 in | Wt 191.2 lb

## 2015-06-05 DIAGNOSIS — K219 Gastro-esophageal reflux disease without esophagitis: Secondary | ICD-10-CM

## 2015-06-05 DIAGNOSIS — E559 Vitamin D deficiency, unspecified: Secondary | ICD-10-CM | POA: Diagnosis not present

## 2015-06-05 DIAGNOSIS — I10 Essential (primary) hypertension: Secondary | ICD-10-CM

## 2015-06-05 DIAGNOSIS — K635 Polyp of colon: Secondary | ICD-10-CM

## 2015-06-05 DIAGNOSIS — E785 Hyperlipidemia, unspecified: Secondary | ICD-10-CM

## 2015-06-05 DIAGNOSIS — K589 Irritable bowel syndrome without diarrhea: Secondary | ICD-10-CM | POA: Insufficient documentation

## 2015-06-05 DIAGNOSIS — M810 Age-related osteoporosis without current pathological fracture: Secondary | ICD-10-CM

## 2015-06-05 LAB — BASIC METABOLIC PANEL
BUN: 10 mg/dL (ref 6–23)
CO2: 30 mEq/L (ref 19–32)
Calcium: 9.4 mg/dL (ref 8.4–10.5)
Chloride: 94 mEq/L — ABNORMAL LOW (ref 96–112)
Creatinine, Ser: 0.64 mg/dL (ref 0.40–1.20)
GFR: 93.62 mL/min (ref >60.00)
Glucose, Bld: 102 mg/dL — ABNORMAL HIGH (ref 70–99)
Potassium: 3.3 mEq/L — ABNORMAL LOW (ref 3.5–5.1)
Sodium: 133 mEq/L — ABNORMAL LOW (ref 135–145)

## 2015-06-05 LAB — HEPATIC FUNCTION PANEL
ALT: 15 U/L (ref 0–35)
AST: 16 U/L (ref 0–37)
Albumin: 4.4 g/dL (ref 3.5–5.2)
Alkaline Phosphatase: 69 U/L (ref 39–117)
Bilirubin, Direct: 0.1 mg/dL (ref 0.0–0.3)
Total Bilirubin: 0.7 mg/dL (ref 0.2–1.2)
Total Protein: 7.2 g/dL (ref 6.0–8.3)

## 2015-06-05 LAB — LIPID PANEL
Cholesterol: 193 mg/dL (ref 0–200)
HDL: 54.6 mg/dL (ref >39.00)
NonHDL: 138.8
Total CHOL/HDL Ratio: 4
Triglycerides: 208 mg/dL — ABNORMAL HIGH (ref 0.0–149.0)
VLDL: 41.6 mg/dL — ABNORMAL HIGH (ref 0.0–40.0)

## 2015-06-05 LAB — LDL CHOLESTEROL, DIRECT: Direct LDL: 109 mg/dL

## 2015-06-05 LAB — VITAMIN D 25 HYDROXY (VIT D DEFICIENCY, FRACTURES): VITD: 44.16 ng/mL (ref 30.00–100.00)

## 2015-06-05 NOTE — Assessment & Plan Note (Signed)
Patient reports that she does not wish to pursue additional colonoscopies at this time. I think this is reasonable given her age.

## 2015-06-05 NOTE — Patient Instructions (Signed)
Please complete lab work prior to leaving. Schedule medicare wellness visit at the front desk.  Welcome to [facility]!

## 2015-06-05 NOTE — Assessment & Plan Note (Signed)
Stable on current meds, obtain BMET

## 2015-06-05 NOTE — Assessment & Plan Note (Signed)
On statin, continue same. Obtain lipid panel.

## 2015-06-05 NOTE — Assessment & Plan Note (Signed)
- 

## 2015-06-05 NOTE — Progress Notes (Signed)
Pre visit review using our clinic review tool, if applicable. No additional management support is needed unless otherwise documented below in the visit note. 

## 2015-06-05 NOTE — Progress Notes (Signed)
Subjective:    Patient ID: Kelsey Butler, female    DOB: [birth year], 82 y.o.   MRN: [id number]  HPI  [name] is an 82 yr old female who presents today to establish care. She saw Dr. Harlene Salts at [facility].    Pmhx is significant for the following:  1) Gerd-on nexium.  Reports symptoms are well controlled.  Has tried to come off in the past by was unable to tolerate off med.   2) HTN- maintained on atenolol, hctz.   BP Readings from Last 3 Encounters:  06/05/15 130/86  04/02/15 149/99   3) IBS- uses Levbid- generally only once a day. Reports symptoms are generally well controlled. Used to take nortriptyline HS.  Has also tried linzess in the past.  Uses miralax daily for chronic constipation.  Also uses colace HS.    4) Hyperlipidemia- on simvastatin. Denies myalgia.  Reports cholesterol has been well controlled on this dose.   5) Osteoporosis-  On oscal. Had femur fracture following fall 2016.  She had hardware placed in [place] (Dr. Peggye Ley). She completed PT following her surgery.  Reports gait is steady.  She has used fosamax, evist, and forteo.  Now on Reclast due in June. This will be her 4th infusion.    6) hx of colon polyps- Had GI in [place]. Last colo 10/15.  Pt thinks no polyps.  She is being treated for IBS.    7) Reports hx of vit d defiency. Review of Systems  Constitutional: Negative for unexpected weight change.  HENT: Negative for rhinorrhea.   Eyes: Negative for visual disturbance.  Respiratory: Negative for cough.   Cardiovascular: Positive for leg swelling.  Gastrointestinal: Positive for constipation.  Musculoskeletal: Positive for arthralgias. Negative for myalgias.       Swelling L ankle always greater the right- attributes to previous hx of bad ankle sprain  Uses motrin occasionally.    Skin: Negative for rash.  Neurological: Negative for headaches.  Hematological: Negative for adenopathy.  Psychiatric/Behavioral:       Denies depression/anxiety         Past Medical History  Diagnosis Date  . GERD (gastroesophageal reflux disease)   . Hypertension   . IBS (irritable bowel syndrome)   . High cholesterol   . Osteoporosis   . History of colon polyps     Social History   Social History  . Marital Status: Married    Spouse Name: N/A  . Number of Children: N/A  . Years of Education: N/A   Occupational History  . Not on file.   Social History Main Topics  . Smoking status: Never Smoker   . Smokeless tobacco: Not on file  . Alcohol Use: No  . Drug Use: Not on file  . Sexual Activity: Not on file   Other Topics Concern  . 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    Grew up in [place]   Enjoys travel by train, reading   Completed 3 yrs of college    Past Surgical History  Procedure Laterality Date  . Femur fracture surgery      right  .  Appendectomy  1942  . Tonsillectomy  1936  . Abdominal hysterectomy  1955  . Ovarian cyst removal  1954  . Parathyroidectomy  2001 & 2006  . Nasal sinus surgery  1990    "had sinuses scraped"  . Esophagogastroduodenoscopy  01/16/14    pt reported  . Cataract  2011    Family History  Problem Relation Age of Onset  . Stroke Mother 58  . Kidney disease Father 40    Allergies  Allergen Reactions  . Sulfa Antibiotics Rash    Current Outpatient Prescriptions on File Prior to Visit  Medication Sig Dispense Refill  . aspirin 81 MG tablet Take 81 mg by mouth daily.    Marland Kitchen atenolol (TENORMIN) 50 MG tablet Take 50 mg by mouth daily.    . calcium-vitamin D (OSCAL WITH D) 500-200 MG-UNIT tablet Take 1 tablet by mouth.    . docusate sodium (COLACE) 100  MG capsule Take 100 mg by mouth 2 (two) times daily.    Marland Kitchen esomeprazole (NEXIUM) 20 MG capsule Take 40 mg by mouth every morning.     . hydrochlorothiazide (HYDRODIURIL) 25 MG tablet Take 25 mg by mouth daily.    . hyoscyamine (LEVBID) 0.375 MG 12 hr tablet Take 0.375 mg by mouth 2 (two) times daily.    . polyethylene glycol (MIRALAX / GLYCOLAX) packet Take 17 g by mouth daily.    . simvastatin (ZOCOR) 40 MG tablet Take 40 mg by mouth daily.     No current facility-administered medications on file prior to visit.    BP 130/86 mmHg  Pulse 73  Temp(Src) 97.8 F (36.6 C) (Oral)  Resp 16  Ht 5\' 6"  (1.676 m)  Wt 191 lb 3.2 oz (86.728 kg)  BMI 30.88 kg/m2  SpO2 98%    Objective:   Physical Exam  Constitutional: She is oriented to person, place, and time. She appears well-developed and well-nourished.  HENT:  Head: Normocephalic and atraumatic.  Right Ear: Tympanic membrane and ear canal normal.  Left Ear: Tympanic membrane and ear canal normal.  Mouth/Throat: No oropharyngeal exudate or posterior oropharyngeal edema.  Eyes: No scleral icterus.  Neck: Neck supple. No thyromegaly present.  Cardiovascular: Normal rate, regular rhythm and normal heart sounds.   No murmur heard. Pulmonary/Chest: Effort normal and breath sounds normal. No respiratory distress. She has no wheezes.  Musculoskeletal: She exhibits no edema.  3+ LLE swelling 2-3+ RLE swelling  Lymphadenopathy:    She has no cervical adenopathy.  Neurological: She is alert and oriented to person, place, and time.  Skin: Skin is warm.  Psychiatric: She has a normal mood and affect. Her behavior is normal. Judgment and thought content normal.          Assessment & Plan:  Records requested for previous PCP.

## 2015-06-05 NOTE — Assessment & Plan Note (Signed)
Due for Reclast 6/17.  I have asked pt to check her records for date of last infusion and contact us in May so that we can arrange.

## 2015-06-05 NOTE — Assessment & Plan Note (Signed)
Stable on PPI, continue same.  

## 2015-06-05 NOTE — Assessment & Plan Note (Signed)
Stable on levbid, continue same.

## 2015-06-07 ENCOUNTER — Other Ambulatory Visit: Payer: Self-pay | Admitting: Family

## 2015-06-07 ENCOUNTER — Encounter: Payer: Self-pay | Admitting: Family

## 2015-06-07 DIAGNOSIS — E876 Hypokalemia: Secondary | ICD-10-CM

## 2015-06-07 NOTE — Addendum Note (Signed)
Addended by: Debbrah Alar on: 06/07/2015 09:32 AM   Modules accepted: Orders

## 2015-06-07 NOTE — Telephone Encounter (Addendum)
Your triglycerides are mildly elevated. Please work on avoiding concentrated sweets, and limiting white carbs (rice/bread/pasta/potatoes). Instead substitute whole grain versions with reasonable portions.  Vit D and liver function normal.   K+ is low , add Kdur 20 Meq once daily repeat bmet in 1 week.

## 2015-06-08 NOTE — Telephone Encounter (Signed)
Left message for pt to return my call.

## 2015-06-09 MED ORDER — POTASSIUM CHLORIDE CRYS ER 20 MEQ PO TBCR: 20.0000 meq | EXTENDED_RELEASE_TABLET | Freq: Every day | ORAL | Status: DC

## 2015-06-09 NOTE — Addendum Note (Signed)
Addended by: Kelle Darting A on: 06/09/2015 02:46 PM   Modules accepted: Orders

## 2015-06-09 NOTE — Telephone Encounter (Signed)
Notified pt and she voices understanding. Rx sent to [facility] on [address] per pt request. Scheduled lab appt for Tuesday and future order signed.

## 2015-06-16 ENCOUNTER — Other Ambulatory Visit (INDEPENDENT_AMBULATORY_CARE_PROVIDER_SITE_OTHER): Payer: Medicare Other

## 2015-06-16 DIAGNOSIS — E876 Hypokalemia: Secondary | ICD-10-CM

## 2015-06-16 LAB — BASIC METABOLIC PANEL
BUN: 10 mg/dL (ref 6–23)
CO2: 30 mEq/L (ref 19–32)
Calcium: 9.2 mg/dL (ref 8.4–10.5)
Chloride: 97 mEq/L (ref 96–112)
Creatinine, Ser: 0.64 mg/dL (ref 0.40–1.20)
GFR: 93.62 mL/min (ref >60.00)
Glucose, Bld: 90 mg/dL (ref 70–99)
Potassium: 3.9 mEq/L (ref 3.5–5.1)
Sodium: 134 mEq/L — ABNORMAL LOW (ref 135–145)

## 2015-06-19 ENCOUNTER — Telehealth: Payer: Self-pay | Admitting: *Deleted

## 2015-06-19 NOTE — Telephone Encounter (Signed)
Faxed records release to Dr Harlene Salts at [phone].  Awaiting records.

## 2015-06-23 ENCOUNTER — Telehealth: Payer: Self-pay | Admitting: *Deleted

## 2015-06-23 NOTE — Telephone Encounter (Signed)
Received Medical Records; forwarded to provider/SLS 03/07

## 2015-06-30 ENCOUNTER — Telehealth: Payer: Self-pay | Admitting: *Deleted

## 2015-06-30 NOTE — Telephone Encounter (Signed)
Received request for last [3] H&Ps w/current medications for Admissions to [facility]; pt is new with only one appointment, faxed that information to [facility] with note attached about pt being new to practice/SLS 03/14

## 2015-07-06 ENCOUNTER — Telehealth: Payer: Self-pay | Admitting: Family

## 2015-07-06 MED ORDER — HYDROCHLOROTHIAZIDE 25 MG PO TABS: 25.0000 mg | ORAL_TABLET | Freq: Every day | ORAL | Status: DC

## 2015-07-06 MED ORDER — POTASSIUM CHLORIDE CRYS ER 20 MEQ PO TBCR: 20.0000 meq | EXTENDED_RELEASE_TABLET | Freq: Every day | ORAL | Status: DC

## 2015-07-06 NOTE — Telephone Encounter (Signed)
Notified pt that she will need to continue potassium.  Pt wants [place] to send both Rxs to [facility]. RXs sent, pt aware.

## 2015-07-06 NOTE — Telephone Encounter (Signed)
Pharmacy: [facility], [place]  Reason for call: pt needs refill on hydrochlorothiazide. She has 2 weeks on hand. Please send RX to [facility].   Pt also asked if she should continue taking potassium chloride. Please call her to advise.

## 2015-07-06 NOTE — Addendum Note (Signed)
Addended by: Kelle Darting A on: 07/06/2015 07:04 PM   Modules accepted: Orders

## 2015-08-12 ENCOUNTER — Telehealth: Payer: Self-pay | Admitting: *Deleted

## 2015-08-12 MED ORDER — ESOMEPRAZOLE MAGNESIUM 20 MG PO CPDR: 40.0000 mg | DELAYED_RELEASE_CAPSULE | ORAL | Status: DC

## 2015-08-12 NOTE — Telephone Encounter (Signed)
Received fax from [facility] requesting refills of nexium. Refills sent.

## 2015-08-18 ENCOUNTER — Ambulatory Visit (INDEPENDENT_AMBULATORY_CARE_PROVIDER_SITE_OTHER): Payer: Medicare Other | Admitting: Family

## 2015-08-18 ENCOUNTER — Encounter: Payer: Self-pay | Admitting: Family

## 2015-08-18 VITALS — BP 120/76 | HR 105 | Temp 97.8°F | Resp 16 | Ht 66.0 in | Wt 191.6 lb

## 2015-08-18 DIAGNOSIS — I10 Essential (primary) hypertension: Secondary | ICD-10-CM | POA: Diagnosis not present

## 2015-08-18 DIAGNOSIS — R0600 Dyspnea, unspecified: Secondary | ICD-10-CM | POA: Diagnosis not present

## 2015-08-18 DIAGNOSIS — E559 Vitamin D deficiency, unspecified: Secondary | ICD-10-CM | POA: Diagnosis not present

## 2015-08-18 DIAGNOSIS — Z0000 Encounter for general adult medical examination without abnormal findings: Secondary | ICD-10-CM | POA: Diagnosis not present

## 2015-08-18 DIAGNOSIS — M810 Age-related osteoporosis without current pathological fracture: Secondary | ICD-10-CM

## 2015-08-18 DIAGNOSIS — E785 Hyperlipidemia, unspecified: Secondary | ICD-10-CM

## 2015-08-18 MED ORDER — HYDROCHLOROTHIAZIDE 25 MG PO TABS: 25.0000 mg | ORAL_TABLET | Freq: Every day | ORAL | Status: DC

## 2015-08-18 MED ORDER — ATENOLOL 50 MG PO TABS: 50.0000 mg | ORAL_TABLET | Freq: Every day | ORAL | Status: DC

## 2015-08-18 NOTE — Assessment & Plan Note (Signed)
Obtain follow up vit D level.  

## 2015-08-18 NOTE — Progress Notes (Addendum)
Subjective:    Patient ID: Kelsey Butler, female    DOB: March 01, 1928, 82 y.o.   MRN: [id number]  HPI    Review of Systems     Objective:   Physical Exam        Assessment & Plan:   Subjective:    Kelsey Butler is a 82 y.o. female who presents for Medicare Annual/Subsequent preventive examination.  Preventive Screening-Counseling & Management  Tobacco History  Smoking status  . Never Smoker   Smokeless tobacco  . Not on file     Problems Prior to Visit 1. HTN- out of atenolol x 1 week. She is also on hctz. BP Readings from Last 3 Encounters:  08/18/15 120/76  06/05/15 130/86  04/02/15 149/99   2.  GERD- on nexium.    3. Vit D deficiency- on oscal with D.   4.  Osteoporosis- last reclast 2016.  8/16 bone density showed osteoporosis. She is due in June.    5. Hyperlipidemia- maintained on simvastatin. Lab Results  Component Value Date   CHOL 193 06/05/2015   HDL 54.60 06/05/2015   LDLDIRECT 109.0 06/05/2015   TRIG 208.0* 06/05/2015   CHOLHDL 4 06/05/2015   6. Patient presents today for complete physical.  Immunizations: up to date per pt will request records from previous provider.  Diet: healthy Exercise: very little, wants to increase Dexa: 2016- osteoporosis Pap Smear: NA  Mammogram: declines further work up.  Had neg mammogram 12/09/14  Notes some dyspnea with bending over.  Bilateral LE edema.   Reports "female jock itch" denies vaginal discharge.   Current Problems (verified) Patient Active Problem List   Diagnosis Date Noted  . GERD (gastroesophageal reflux disease) 06/05/2015  . HTN (hypertension) 06/05/2015  . IBS (irritable bowel syndrome) 06/05/2015  . Hyperlipidemia 06/05/2015  . Vitamin D deficiency 06/05/2015  . Osteoporosis 06/05/2015  . Colon polyps 06/05/2015    Medications Prior to Visit Current Outpatient Prescriptions on File Prior to Visit  Medication Sig Dispense Refill  . aspirin 81 MG tablet Take 81 mg by mouth daily.     Marland Kitchen atenolol (TENORMIN) 50 MG tablet Take 50 mg by mouth daily.    . calcium-vitamin D (OSCAL WITH D) 500-200 MG-UNIT tablet Take 1 tablet by mouth.    . docusate sodium (COLACE) 100 MG capsule Take 100 mg by mouth 2 (two) times daily.    Marland Kitchen esomeprazole (NEXIUM) 20 MG capsule Take 2 capsules (40 mg total) by mouth every morning. 180 capsule 1  . hydrochlorothiazide (HYDRODIURIL) 25 MG tablet Take 1 tablet (25 mg total) by mouth daily. 90 tablet 1  . polyethylene glycol (MIRALAX / GLYCOLAX) packet Take 17 g by mouth daily.    . potassium chloride SA (K-DUR,KLOR-CON) 20 MEQ tablet Take 1 tablet (20 mEq total) by mouth daily. 90 tablet 1  . simvastatin (ZOCOR) 40 MG tablet Take 40 mg by mouth daily.     No current facility-administered medications on file prior to visit.    Current Medications (verified) Current Outpatient Prescriptions  Medication Sig Dispense Refill  . aspirin 81 MG tablet Take 81 mg by mouth daily.    Marland Kitchen atenolol (TENORMIN) 50 MG tablet Take 50 mg by mouth daily.    . calcium-vitamin D (OSCAL WITH D) 500-200 MG-UNIT tablet Take 1 tablet by mouth.    . docusate sodium (COLACE) 100 MG capsule Take 100 mg by mouth 2 (two) times daily.    Marland Kitchen esomeprazole (NEXIUM) 20 MG capsule Take 2  capsules (40 mg total) by mouth every morning. 180 capsule 1  . hydrochlorothiazide (HYDRODIURIL) 25 MG tablet Take 1 tablet (25 mg total) by mouth daily. 90 tablet 1  . hyoscyamine (SYMAX-SL) 0.125 MG SL tablet Place 0.125 mg under the tongue every 4 (four) hours as needed for cramping.    . polyethylene glycol (MIRALAX / GLYCOLAX) packet Take 17 g by mouth daily.    . potassium chloride SA (K-DUR,KLOR-CON) 20 MEQ tablet Take 1 tablet (20 mEq total) by mouth daily. 90 tablet 1  . simvastatin (ZOCOR) 40 MG tablet Take 40 mg by mouth daily.     No current facility-administered medications for this visit.     Allergies (verified) Sulfa antibiotics   PAST HISTORY  Family History Family History    Problem Relation Age of Onset  . Stroke Mother 29  . Kidney disease Father 22    Social History Social History  Substance Use Topics  . Smoking status: Never Smoker   . Smokeless tobacco: Not on file  . Alcohol Use: No     Are there smokers in your home (other than you)? No  Risk Factors Current exercise habits: The patient does not participate in regular exercise at present.  Dietary issues discussed: continue healthy diet   Cardiac risk factors: advanced age (older than 47 for men, 68 for women) and sedentary lifestyle.  Depression Screen (Note: if answer to either of the following is "Yes", a more complete depression screening is indicated)   Over the past two weeks, have you felt down, depressed or hopeless? No  Over the past two weeks, have you felt little interest or pleasure in doing things? No  Have you lost interest or pleasure in daily life? No  Do you often feel hopeless? No  Do you cry easily over simple problems? No  Activities of Daily Living In your present state of health, do you have any difficulty performing the following activities?:  Driving? No- but gave up driving. Managing money?  No Feeding yourself? No Getting from bed to chair? No . Climbing a flight of stairs? No Preparing food and eating?: No Bathing or showering? No Getting dressed: No Getting to the toilet? No Using the toilet:No Moving around from place to place: No In the past year have you fallen or had a near fall?:No   Are you sexually active?  No  Do you have more than one partner?  No  Hearing Difficulties: No Do you often ask people to speak up or repeat themselves? No Do you experience ringing or noises in your ears? No Do you have difficulty understanding soft or whispered voices? No    Do you feel that you have a problem with memory? No  Do you often misplace items? No  Do you feel safe at home?  Yes  Cognitive Testing  Alert? Yes  Normal Appearance?Yes  Oriented to  person? Yes  Place? Yes   Time? Yes  Recall of three objects?  Yes  Can perform simple calculations? Yes  Displays appropriate judgment?Yes  Can read the correct time from a watch face?Yes   Advanced Directives have been discussed with the patient? Yes  List the Names of Other Physician/Practitioners you currently use: 1.  none  Indicate any recent Medical Services you may have received from other than [facility] providers in the past year (date may be approximate).   There is no immunization history on file for this patient.  Screening Tests Health Maintenance  Topic  Date Due  . TETANUS/TDAP  11/30/1948  . PNA vac Low Risk Adult (1 of 2 - PCV13) 12/01/1994  . INFLUENZA VACCINE  11/17/2015  . DEXA SCAN  Completed  . ZOSTAVAX  Completed    All answers were reviewed with the patient and necessary referrals were made:  O'SULLIVAN,Aren Cherne S., NP   08/18/2015   History reviewed:    Review of Systems Pertinent items are noted in HPI.    Objective:     Vision by Snellen chart: right eye:., left eye:.  Body mass index is 30.94 kg/(m^2). BP 120/76 mmHg  Pulse 105  Temp(Src) 97.8 F (36.6 C) (Oral)  Resp 16  Ht 5\' 6"  (1.676 m)  Wt 191 lb 9.6 oz (86.909 kg)  BMI 30.94 kg/m2  SpO2 97%   Physical Exam  Constitutional: She is oriented to person, place, and time. She appears well-developed and well-nourished. No distress.  HENT:  Head: Normocephalic and atraumatic.  Right Ear: Tympanic membrane and ear canal normal.  Left Ear: Tympanic membrane and ear canal normal.  Mouth/Throat: Oropharynx is clear and moist.  Eyes: Pupils are equal, round, and reactive to light. No scleral icterus.  Neck: Normal range of motion. No thyromegaly present.  Cardiovascular: Normal rate and regular rhythm.   No murmur heard. Pulmonary/Chest: Effort normal and breath sounds normal. No respiratory distress. He has no wheezes. She has no rales. She exhibits no tenderness.  Abdominal: Soft. Bowel  sounds are normal. He exhibits no distension and no mass. There is no tenderness. There is no rebound and no guarding.  Musculoskeletal: She exhibits 2-3+ bilateral LE edema Lymphadenopathy:    She has no cervical adenopathy.  Neurological: She is alert and oriented to person, place, and time.  She exhibits normal muscle tone. Coordination normal.  Skin: Skin is warm and dry.  Psychiatric: She has a normal mood and affect. Her behavior is normal. Judgment and thought content normal.  Breasts: Examined lying (bilateral inverted nipples noted) Right: Without masses, retractions, discharge or axillary adenopathy.  Left: Without masses, retractions, discharge or axillary adenopathy.           Assessment & Plan:        Assessment:          Plan:     During the course of the visit the patient was educated and counseled about appropriate screening and preventive services including:    Advanced directives: has advanced directive, I have asked pt to provide [place] with a copy for her chart.  Diet review for nutrition referral? Yes ____  Not Indicated _x__   Patient Instructions (the written plan) was given to the patient.  Medicare Attestation I have personally reviewed: The patient's medical and social history Their use of alcohol, tobacco or illicit drugs Their current medications and supplements The patient's functional ability including ADLs,fall risks, home safety risks, cognitive, and hearing and visual impairment Diet and physical activities Evidence for depression or mood disorders  The patient's weight, height, BMI, and visual acuity have been recorded in the chart.  I have made referrals, counseling, and provided education to the patient based on review of the above and I have provided the patient with a written personalized care plan for preventive services.     O'SULLIVAN,Talulah Schirmer S., NP   08/18/2015

## 2015-08-18 NOTE — Assessment & Plan Note (Signed)
BP looks good. Refill sent for atenolol.

## 2015-08-18 NOTE — Assessment & Plan Note (Signed)
Will arrange reclast infusion for June.

## 2015-08-18 NOTE — Patient Instructions (Addendum)
Start Monistat 3 for vaginal itching, call if symptoms worsen or do not improve. This is available OTC.   You will be contacted about your echocardiogram. Please complete lab work prior to leaving.

## 2015-08-18 NOTE — Assessment & Plan Note (Signed)
LDL at goal, continue simvastatin.  

## 2015-08-18 NOTE — Progress Notes (Signed)
Pre visit review using our clinic review tool, if applicable. No additional management support is needed unless otherwise documented below in the visit note. 

## 2015-08-19 ENCOUNTER — Encounter: Payer: Self-pay | Admitting: Family

## 2015-08-19 LAB — BASIC METABOLIC PANEL
BUN: 11 mg/dL (ref 6–23)
CO2: 27 mEq/L (ref 19–32)
Calcium: 9.7 mg/dL (ref 8.4–10.5)
Chloride: 95 mEq/L — ABNORMAL LOW (ref 96–112)
Creatinine, Ser: 0.65 mg/dL (ref 0.40–1.20)
GFR: 91.92 mL/min (ref >60.00)
Glucose, Bld: 106 mg/dL — ABNORMAL HIGH (ref 70–99)
Potassium: 3.6 mEq/L (ref 3.5–5.1)
Sodium: 134 mEq/L — ABNORMAL LOW (ref 135–145)

## 2015-08-19 LAB — VITAMIN D 25 HYDROXY (VIT D DEFICIENCY, FRACTURES): VITD: 36.67 ng/mL (ref 30.00–100.00)

## 2015-08-26 ENCOUNTER — Ambulatory Visit (HOSPITAL_BASED_OUTPATIENT_CLINIC_OR_DEPARTMENT_OTHER): Payer: Medicare Other

## 2015-08-27 ENCOUNTER — Other Ambulatory Visit (HOSPITAL_BASED_OUTPATIENT_CLINIC_OR_DEPARTMENT_OTHER): Payer: Medicare Other

## 2015-09-02 ENCOUNTER — Ambulatory Visit (HOSPITAL_BASED_OUTPATIENT_CLINIC_OR_DEPARTMENT_OTHER)
Admission: RE | Admit: 2015-09-02 | Discharge: 2015-09-02 | Disposition: A | Discharge status: Home / Self Care | Payer: Medicare Other | Source: Ambulatory Visit | Attending: Family | Admitting: Family

## 2015-09-02 DIAGNOSIS — R0600 Dyspnea, unspecified: Secondary | ICD-10-CM | POA: Diagnosis not present

## 2015-09-02 NOTE — Progress Notes (Signed)
  Echocardiogram 2D Echocardiogram has been performed.  [name] 09/02/2015, 2:33 PM

## 2015-09-03 ENCOUNTER — Encounter: Payer: Self-pay | Admitting: Family

## 2015-09-18 ENCOUNTER — Telehealth: Payer: Self-pay | Admitting: *Deleted

## 2015-09-18 NOTE — Telephone Encounter (Signed)
Faxed records release to Harless Litten, MD at [facility], [place], [place] @ [phone]. Awaiting records.

## 2015-09-23 DIAGNOSIS — H26493 Other secondary cataract, bilateral: Secondary | ICD-10-CM | POA: Diagnosis not present

## 2015-09-23 DIAGNOSIS — H524 Presbyopia: Secondary | ICD-10-CM | POA: Diagnosis not present

## 2015-09-23 DIAGNOSIS — H527 Unspecified disorder of refraction: Secondary | ICD-10-CM | POA: Diagnosis not present

## 2015-09-28 ENCOUNTER — Ambulatory Visit: Payer: Medicare Other | Admitting: Family

## 2015-09-30 ENCOUNTER — Telehealth: Payer: Self-pay | Admitting: *Deleted

## 2015-09-30 DIAGNOSIS — M810 Age-related osteoporosis without current pathological fracture: Secondary | ICD-10-CM

## 2015-09-30 NOTE — Telephone Encounter (Signed)
Notified pt and she scheduled lab appt for 10/06/15. Order entered. Will await result.

## 2015-09-30 NOTE — Telephone Encounter (Signed)
Left message for pt to return my call. Needs relcast infusion (for osteoporosis) and bmet within 30 days of infusion. Need to schedule lab appt. Once results are back I will fax them along with order to [facility] @ [phone].  Order placed in blue folder on my desk.

## 2015-10-06 ENCOUNTER — Other Ambulatory Visit (INDEPENDENT_AMBULATORY_CARE_PROVIDER_SITE_OTHER): Payer: Medicare Other

## 2015-10-06 DIAGNOSIS — M810 Age-related osteoporosis without current pathological fracture: Secondary | ICD-10-CM | POA: Diagnosis not present

## 2015-10-06 LAB — BASIC METABOLIC PANEL
BUN: 12 mg/dL (ref 6–23)
CO2: 28 mEq/L (ref 19–32)
Calcium: 9.4 mg/dL (ref 8.4–10.5)
Chloride: 96 mEq/L (ref 96–112)
Creatinine, Ser: 0.74 mg/dL (ref 0.40–1.20)
GFR: 79.12 mL/min (ref >60.00)
Glucose, Bld: 98 mg/dL (ref 70–99)
Potassium: 3.7 mEq/L (ref 3.5–5.1)
Sodium: 132 mEq/L — ABNORMAL LOW (ref 135–145)

## 2015-10-07 ENCOUNTER — Encounter: Payer: Self-pay | Admitting: Family

## 2015-10-07 NOTE — Telephone Encounter (Signed)
See mychart message. Lets bring her back in 3 months instead of 6 please.

## 2015-10-08 ENCOUNTER — Other Ambulatory Visit: Payer: Self-pay | Admitting: Family

## 2015-10-08 DIAGNOSIS — M810 Age-related osteoporosis without current pathological fracture: Secondary | ICD-10-CM

## 2015-10-08 NOTE — Telephone Encounter (Signed)
Spoke with billing at [facility] for 20% estimate of Reclast. ($240). Order/lab faxed to below # and entered into EPIC. Spoke with [facility] and scheduled infusion for 10/30/15 at 1pm. Pt to arrive at 12:45pm at main entrance and go directly to admitting. Notified pt and she voices understanding. States she will have to reschedule as she cannot get transportation on Fridays. Gave pt # and she will call to r/s.

## 2015-10-08 NOTE — Telephone Encounter (Signed)
Notified pt and she voices understanding. Forgot to change appt. Will call pt back.

## 2015-10-21 NOTE — Telephone Encounter (Signed)
Appt for November has been rescheduled for 01/08/16 at 10:45am. Message sent to pt/.

## 2015-10-28 ENCOUNTER — Telehealth: Payer: Self-pay | Admitting: *Deleted

## 2015-10-28 NOTE — Telephone Encounter (Signed)
PA form completed and faxed to [facility] successfully at [phone] Awaiting determination

## 2015-10-30 ENCOUNTER — Ambulatory Visit (HOSPITAL_COMMUNITY): Payer: Medicare Other

## 2015-10-30 NOTE — Telephone Encounter (Signed)
PA approved through 04/17/2098. Approval letter sent for scanning. JG//CMA

## 2015-11-03 ENCOUNTER — Encounter (HOSPITAL_COMMUNITY): Payer: Self-pay

## 2015-11-03 ENCOUNTER — Ambulatory Visit (HOSPITAL_COMMUNITY)
Admission: RE | Admit: 2015-11-03 | Discharge: 2015-11-03 | Disposition: A | Discharge status: Home / Self Care | Payer: Medicare Other | Source: Ambulatory Visit | Attending: Family | Admitting: Family

## 2015-11-03 VITALS — BP 140/62 | HR 82 | Temp 97.9°F | Resp 15 | Ht 66.0 in | Wt 195.4 lb

## 2015-11-03 DIAGNOSIS — M810 Age-related osteoporosis without current pathological fracture: Secondary | ICD-10-CM | POA: Insufficient documentation

## 2015-11-03 MED ORDER — ZOLEDRONIC ACID 5 MG/100ML IV SOLN
5.0000 mg | Freq: Once | INTRAVENOUS | Status: AC
  Administered 2015-11-03: 5 mg via INTRAVENOUS
  Filled 2015-11-03: qty 100

## 2015-11-03 MED ORDER — SODIUM CHLORIDE 0.9 % IV SOLN
Freq: Once | INTRAVENOUS | Status: AC
  Administered 2015-11-03: 13:00:00 via INTRAVENOUS

## 2015-11-03 NOTE — Discharge Instructions (Signed)

## 2015-11-05 ENCOUNTER — Ambulatory Visit (INDEPENDENT_AMBULATORY_CARE_PROVIDER_SITE_OTHER): Payer: Medicare Other | Admitting: Family

## 2015-11-05 ENCOUNTER — Telehealth: Payer: Self-pay | Admitting: Family

## 2015-11-05 ENCOUNTER — Encounter: Payer: Self-pay | Admitting: Family

## 2015-11-05 ENCOUNTER — Other Ambulatory Visit: Payer: Self-pay | Admitting: Family

## 2015-11-05 VITALS — BP 128/82 | HR 75 | Temp 97.9°F | Resp 18 | Ht 66.0 in | Wt 195.0 lb

## 2015-11-05 DIAGNOSIS — Z23 Encounter for immunization: Secondary | ICD-10-CM | POA: Diagnosis not present

## 2015-11-05 DIAGNOSIS — K589 Irritable bowel syndrome without diarrhea: Secondary | ICD-10-CM

## 2015-11-05 DIAGNOSIS — I10 Essential (primary) hypertension: Secondary | ICD-10-CM

## 2015-11-05 DIAGNOSIS — E785 Hyperlipidemia, unspecified: Secondary | ICD-10-CM | POA: Diagnosis not present

## 2015-11-05 MED ORDER — RANITIDINE HCL 150 MG PO CAPS: 150.0000 mg | ORAL_CAPSULE | Freq: Two times a day (BID) | ORAL | Status: DC

## 2015-11-05 NOTE — Assessment & Plan Note (Signed)
LDL at goal, tolerating statin, continue same.  

## 2015-11-05 NOTE — Assessment & Plan Note (Signed)
BP stable on current meds, continue same.  

## 2015-11-05 NOTE — Patient Instructions (Addendum)
Try adding claritin 10mg  once daily for allergies.  For headache you can try tylenol as needed. Use the Levsin under your tongue as needed. Follow up as scheduled.

## 2015-11-05 NOTE — Progress Notes (Signed)
Pre visit review using our clinic review tool, if applicable. No additional management support is needed unless otherwise documented below in the visit note. 

## 2015-11-05 NOTE — Telephone Encounter (Signed)
There is a note in pt's chart from 10/28/15 saying Nexium was approved through 2099. Please advise?

## 2015-11-05 NOTE — Progress Notes (Signed)
Subjective:    Patient ID: Kelsey Butler, female    DOB: 1927-12-27, 82 y.o.   MRN: [id number]  HPI  Kelsey Butler is an 81 yr old female who presents today for follow up.  1) Hyperlipidemia- on simvastatin 40mg .  Denies myalgia.    Lab Results  Component Value Date   CHOL 193 06/05/2015   HDL 54.60 06/05/2015   LDLDIRECT 109.0 06/05/2015   TRIG 208.0* 06/05/2015   CHOLHDL 4 06/05/2015   2) HTN- on atenolol, hctz, kdur.  Reports LE swelling is stable. Wears support hose.  BP Readings from Last 3 Encounters:  11/05/15 128/82  11/03/15 140/62  08/18/15 120/76   3) Headache- reports + HA x 2 days. Reports HA is frontal. "feels like sinus." Denies sinus drainage. HA is mild.  Has + allergy symptoms.   4) IBS- continues levsin, but feels like she is having flare up. Previously took linzess. She reports that she has chronic constipation. Using miralax on a daily basis and is having daily BM's.  Reports some cramping. Has had some recent stress which she feels is exacerbating her headache.    Review of Systems See HPI  Past Medical History  Diagnosis Date  . GERD (gastroesophageal reflux disease)   . Hypertension   . IBS (irritable bowel syndrome)   . High cholesterol   . Osteoporosis   . History of colon polyps      Social History   Social History  . Marital Status: Married    Spouse Name: N/A  . Number of Children: N/A  . Years of Education: N/A   Occupational History  . Not on file.   Social History Main Topics  . Smoking status: Never Smoker   . Smokeless tobacco: Not on file  . Alcohol Use: No  . Drug Use: Not on file  . Sexual Activity: Not on file   Other Topics Concern  . 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    Grew up in [place]   Enjoys travel by train, reading   Completed 3 yrs of college    Past Surgical History  Procedure Laterality Date  . Femur fracture surgery      right  . Appendectomy  1942  . Tonsillectomy  1936  . Abdominal hysterectomy  1955  . Ovarian cyst removal  1954  . Parathyroidectomy  2001 & 2006  . Nasal sinus surgery  1990    "had sinuses scraped"  . Esophagogastroduodenoscopy  01/16/14    pt reported  . Cataract  2011    Family History  Problem Relation Age of Onset  . Stroke Mother 40  . Kidney disease Father 36    Allergies  Allergen Reactions  . Sulfa Antibiotics Rash    Current Outpatient Prescriptions on File Prior to Visit  Medication Sig Dispense Refill  . aspirin 81 MG tablet Take 81 mg by mouth daily.    Marland Kitchen atenolol (TENORMIN) 50 MG tablet Take 1 tablet (50 mg total) by mouth daily. 90 tablet 1  . calcium-vitamin D (OSCAL WITH D) 500-200 MG-UNIT tablet Take 1 tablet by mouth.    Marland Kitchen  docusate sodium (COLACE) 100 MG capsule Take 100 mg by mouth 2 (two) times daily.    Marland Kitchen esomeprazole (NEXIUM) 20 MG capsule Take 2 capsules (40 mg total) by mouth every morning. 180 capsule 1  . hydrochlorothiazide (HYDRODIURIL) 25 MG tablet Take 1 tablet (25 mg total) by mouth daily. 90 tablet 1  . hyoscyamine (SYMAX-SL) 0.125 MG SL tablet Place 0.125 mg under the tongue every 4 (four) hours as needed for cramping.    . polyethylene glycol (MIRALAX / GLYCOLAX) packet Take 17 g by mouth daily.    . potassium chloride SA (K-DUR,KLOR-CON) 20 MEQ tablet Take 1 tablet (20 mEq total) by mouth daily. 90 tablet 1  . simvastatin (ZOCOR) 40 MG tablet Take 40 mg by mouth daily.     No current facility-administered medications on file prior to visit.    BP 128/82 mmHg  Pulse 75  Temp(Src) 97.9 F (36.6 C) (Oral)  Resp 18  Ht 5\' 6"  (1.676 m)  Wt 195 lb (88.451 kg)  BMI 31.49 kg/m2  SpO2  97%       Objective:   Physical Exam  Constitutional: She is oriented to person, place, and time. She appears well-developed and well-nourished. No distress.  HENT:  Head: Normocephalic and atraumatic.  Mild frontal sinus tenderness to palpation  Cardiovascular: Normal rate and regular rhythm.   No murmur heard. Pulmonary/Chest: Effort normal and breath sounds normal. No respiratory distress. She has no wheezes. She has no rales. She exhibits no tenderness.  Musculoskeletal:  2+ bilateral LE edema  Neurological: She is alert and oriented to person, place, and time.  Psychiatric: She has a normal mood and affect. Her behavior is normal. Judgment and thought content normal.          Assessment & Plan:  Headache- suspect sinus/allergy related. Advised pt on trial of claritin, tylenol prn. Let me know if fever/worsening symptoms.   Tdap and prevnar today.

## 2015-11-05 NOTE — Assessment & Plan Note (Signed)
Overall stable. Continues levsin, miralax. Advised pt to try levsin SL as she has been swallowing whole.

## 2015-11-05 NOTE — Telephone Encounter (Signed)
Received denial for nexium from [facility]. They are recommending an alternative rx. I would like her to stop nexium, try zantac instead. rx sent. Call if worsening gerd symptoms on zantac.

## 2015-11-08 NOTE — Telephone Encounter (Signed)
I still would like to try her on zantac instead if she is able to tolerate.

## 2015-11-09 NOTE — Telephone Encounter (Signed)
Notified pt. 

## 2015-11-24 ENCOUNTER — Encounter: Payer: Self-pay | Admitting: Family

## 2015-11-24 MED ORDER — SIMVASTATIN 40 MG PO TABS: 40.0000 mg | ORAL_TABLET | Freq: Every day | ORAL | 1 refills | Status: DC

## 2015-11-25 MED ORDER — SIMVASTATIN 40 MG PO TABS: 40.0000 mg | ORAL_TABLET | Freq: Every day | ORAL | 0 refills | Status: DC

## 2015-12-05 ENCOUNTER — Encounter: Payer: Self-pay | Admitting: Family

## 2015-12-07 MED ORDER — HYOSCYAMINE SULFATE 0.125 MG SL SUBL: 0.1250 mg | SUBLINGUAL_TABLET | SUBLINGUAL | 1 refills | Status: DC | PRN

## 2015-12-16 ENCOUNTER — Other Ambulatory Visit: Payer: Self-pay | Admitting: Family

## 2016-01-05 ENCOUNTER — Encounter: Payer: Self-pay | Admitting: Family

## 2016-01-05 ENCOUNTER — Ambulatory Visit (INDEPENDENT_AMBULATORY_CARE_PROVIDER_SITE_OTHER): Payer: Medicare Other | Admitting: Family

## 2016-01-05 DIAGNOSIS — K589 Irritable bowel syndrome without diarrhea: Secondary | ICD-10-CM

## 2016-01-05 DIAGNOSIS — E785 Hyperlipidemia, unspecified: Secondary | ICD-10-CM

## 2016-01-05 DIAGNOSIS — K219 Gastro-esophageal reflux disease without esophagitis: Secondary | ICD-10-CM | POA: Diagnosis not present

## 2016-01-05 DIAGNOSIS — I10 Essential (primary) hypertension: Secondary | ICD-10-CM

## 2016-01-05 DIAGNOSIS — Z23 Encounter for immunization: Secondary | ICD-10-CM | POA: Diagnosis not present

## 2016-01-05 MED ORDER — NORTRIPTYLINE HCL 10 MG PO CAPS: 10.0000 mg | ORAL_CAPSULE | Freq: Every day | ORAL | 3 refills | Status: DC

## 2016-01-05 MED ORDER — ESOMEPRAZOLE MAGNESIUM 40 MG PO CPDR: 40.0000 mg | DELAYED_RELEASE_CAPSULE | Freq: Every day | ORAL | 5 refills | Status: DC

## 2016-01-05 NOTE — Assessment & Plan Note (Signed)
D/c zantac as her gerd symptoms were not as well controlled on zantac.

## 2016-01-05 NOTE — Patient Instructions (Addendum)
Please stop zantac. Restart nexium once daily. Start nortyptiline 10mg  once daily.

## 2016-01-05 NOTE — Assessment & Plan Note (Signed)
Uncontrolled. Did better on nexium and pamelor. Will restart these meds.

## 2016-01-05 NOTE — Assessment & Plan Note (Signed)
BP stable on current meds. Continue same.  

## 2016-01-05 NOTE — Assessment & Plan Note (Signed)
Lab Results  Component Value Date   CHOL 193 06/05/2015   HDL 54.60 06/05/2015   LDLDIRECT 109.0 06/05/2015   TRIG 208.0 (H) 06/05/2015   CHOLHDL 4 06/05/2015   LDL at goal.  Continue statin.

## 2016-01-05 NOTE — Progress Notes (Signed)
Subjective:    Patient ID: Kelsey Butler, female    DOB: December 13, 1927, 81 y.o.   MRN: [id number]  HPI  Kelsey Butler is an 82 yr old female who presents today for follow up.  1) HTN- currently maintained on hctz and Atenolol.  BP Readings from Last 3 Encounters:  01/05/16 137/71  11/05/15 128/82  11/03/15 140/62   2) Hyperlipidemia- maintained on simvastatin.   3) GERD-maintained on ranitidine.  4) IBS- used to take nortriptyline for ibs.seems like her bloating has worsened. Has had some mild reflux                                 Review of Systems See HPI  Past Medical History:  Diagnosis Date  . GERD (gastroesophageal reflux disease)   . High cholesterol   . History of colon polyps   . Hypertension   . IBS (irritable bowel syndrome)   . Osteoporosis      Social History   Social History  . Marital status: Married    Spouse name: N/A  . Number of children: N/A  . Years of education: N/A   Occupational History  . Not on file.   Social History Main Topics  . Smoking status: Never Smoker  . Smokeless tobacco: Not on file  . Alcohol use No  . Drug use: Unknown  . Sexual activity: Not on file   Other Topics Concern  . 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    Grew up in [place]   Enjoys travel by train, reading   Completed 3 yrs of college    Past Surgical History:  Procedure Laterality Date  . ABDOMINAL HYSTERECTOMY  1955  . APPENDECTOMY  1942  . cataract  2011  . ESOPHAGOGASTRODUODENOSCOPY  01/16/14   pt reported  . FEMUR FRACTURE SURGERY     right  . NASAL SINUS SURGERY  1990   "had sinuses  scraped"  . OVARIAN CYST REMOVAL  1954  . PARATHYROIDECTOMY  2001 & 2006  . TONSILLECTOMY  1936    Family History  Problem Relation Age of Onset  . Stroke Mother 73  . Kidney disease Father 38    Allergies  Allergen Reactions  . Sulfa Antibiotics Rash    Current Outpatient Prescriptions on File Prior to Visit  Medication Sig Dispense Refill  . aspirin 81 MG tablet Take 81 mg by mouth daily.    Marland Kitchen atenolol (TENORMIN) 50 MG tablet Take 1 tablet (50 mg total) by mouth daily. 90 tablet 1  . calcium-vitamin D (OSCAL WITH D) 500-200 MG-UNIT tablet Take 1 tablet by mouth.    . docusate sodium (COLACE) 100 MG capsule Take 100 mg by mouth 2 (two) times daily.    . hydrochlorothiazide (HYDRODIURIL) 25 MG tablet Take 1 tablet (25 mg total) by mouth daily. 90 tablet 1  . hyoscyamine (SYMAX-SL) 0.125 MG SL tablet Place 1 tablet (0.125 mg total) under the tongue every 4 (four) hours as needed for cramping. 40 tablet 1  . KLOR-CON M20 20 MEQ tablet TAKE  1 TABLET DAILY 90 tablet 1  . polyethylene glycol (MIRALAX / GLYCOLAX) packet Take 17 g by mouth daily.    . simvastatin (ZOCOR) 40 MG tablet Take 1 tablet (40 mg total) by mouth daily. 14 tablet 0   No current facility-administered medications on file prior to visit.     BP 137/71 (BP Location: Right Arm, Cuff Size: Large)   Pulse 74   Temp 97.9 F (36.6 C) (Oral)   Resp 20   Ht 5\' 6"  (1.676 m)   Wt 195 lb (88.5 kg)   SpO2 98% Comment: room air  BMI 31.47 kg/m       Objective:   Physical Exam  Constitutional: She is oriented to person, place, and time. She appears well-developed and well-nourished.  HENT:  Head: Normocephalic and atraumatic.  Eyes: No scleral icterus.  Cardiovascular: Normal rate, regular rhythm and normal heart sounds.   No murmur heard. Pulmonary/Chest: Effort normal and breath sounds normal. No respiratory distress. She has no wheezes.  Abdominal: Soft. Bowel sounds are normal. She exhibits no distension.  There is no tenderness.  Musculoskeletal: She exhibits no edema.  Neurological: She is alert and oriented to person, place, and time.  Psychiatric: She has a normal mood and affect. Her behavior is normal. Judgment and thought content normal.          Assessment & Plan:

## 2016-01-05 NOTE — Progress Notes (Signed)
Pre visit review using our clinic review tool, if applicable. No additional management support is needed unless otherwise documented below in the visit note. 

## 2016-01-06 ENCOUNTER — Telehealth: Payer: Self-pay | Admitting: *Deleted

## 2016-01-06 MED ORDER — ESOMEPRAZOLE MAGNESIUM 40 MG PO CPDR
40.0000 mg | DELAYED_RELEASE_CAPSULE | Freq: Every day | ORAL | 1 refills | Status: DC
Start: 2016-01-06 — End: 2016-08-17

## 2016-01-06 NOTE — Telephone Encounter (Signed)
Received fax from [facility] requesting 90 day supply of nexium. Previous rx from 01/04/16 was 30 day supply. Rx re-sent.

## 2016-01-07 ENCOUNTER — Other Ambulatory Visit: Payer: Self-pay | Admitting: Family Medicine

## 2016-01-08 ENCOUNTER — Ambulatory Visit: Payer: Medicare Other | Admitting: Family

## 2016-01-12 NOTE — Telephone Encounter (Signed)
Pt last seen by you 01/05/16 Medication last filled by Dr.Copland  12/07/15 Please advise----PC

## 2016-02-14 ENCOUNTER — Other Ambulatory Visit: Payer: Self-pay | Admitting: Family

## 2016-02-15 NOTE — Telephone Encounter (Signed)
Pt is scheduled for appointment 04/05/16. I have refilled Rx #90 tablets with 1 refill for atenlol. TL/CMA

## 2016-02-19 ENCOUNTER — Other Ambulatory Visit: Payer: Self-pay | Admitting: Family

## 2016-02-23 ENCOUNTER — Ambulatory Visit: Payer: Medicare Other | Admitting: Family

## 2016-04-05 ENCOUNTER — Ambulatory Visit (INDEPENDENT_AMBULATORY_CARE_PROVIDER_SITE_OTHER): Payer: Medicare Other | Admitting: Family

## 2016-04-05 ENCOUNTER — Encounter: Payer: Self-pay | Admitting: Family

## 2016-04-05 VITALS — BP 130/66 | HR 84 | Temp 98.1°F | Ht 66.0 in | Wt 198.2 lb

## 2016-04-05 DIAGNOSIS — K219 Gastro-esophageal reflux disease without esophagitis: Secondary | ICD-10-CM

## 2016-04-05 DIAGNOSIS — E871 Hypo-osmolality and hyponatremia: Secondary | ICD-10-CM | POA: Diagnosis not present

## 2016-04-05 DIAGNOSIS — K589 Irritable bowel syndrome without diarrhea: Secondary | ICD-10-CM

## 2016-04-05 DIAGNOSIS — E785 Hyperlipidemia, unspecified: Secondary | ICD-10-CM

## 2016-04-05 LAB — BASIC METABOLIC PANEL
BUN: 11 mg/dL (ref 6–23)
CO2: 30 mEq/L (ref 19–32)
Calcium: 9.3 mg/dL (ref 8.4–10.5)
Chloride: 97 mEq/L (ref 96–112)
Creatinine, Ser: 0.66 mg/dL (ref 0.40–1.20)
GFR: 90.18 mL/min (ref >60.00)
Glucose, Bld: 98 mg/dL (ref 70–99)
Potassium: 4.4 mEq/L (ref 3.5–5.1)
Sodium: 134 mEq/L — ABNORMAL LOW (ref 135–145)

## 2016-04-05 LAB — LIPID PANEL
Cholesterol: 172 mg/dL (ref 0–200)
HDL: 56.1 mg/dL (ref >39.00)
LDL Cholesterol: 82 mg/dL (ref 0–99)
NonHDL: 116.16
Total CHOL/HDL Ratio: 3
Triglycerides: 169 mg/dL — ABNORMAL HIGH (ref 0.0–149.0)
VLDL: 33.8 mg/dL (ref 0.0–40.0)

## 2016-04-05 NOTE — Progress Notes (Signed)
Subjective:    Patient ID: Kelsey Butler, female    DOB: October 06, 1927, 82 y.o.   MRN: [id number]  HPI   Kelsey Butler is an 82 yr old female who presents today for follow up.  IBS- last visit we restarted nortryptiline.  Reports regular BM's. No constipation no diarrha.   GERD- restarted nexium.  Reports resolution of her gerd symptoms.   Review of Systems See HPI  Past Medical History:  Diagnosis Date  . GERD (gastroesophageal reflux disease)   . High cholesterol   . History of colon polyps   . Hypertension   . IBS (irritable bowel syndrome)   . Osteoporosis      Social History   Social History  . Marital status: Married    Spouse name: N/A  . Number of children: N/A  . Years of education: N/A   Occupational History  . Not on file.   Social History Main Topics  . Smoking status: Never Smoker  . Smokeless tobacco: Not on file  . Alcohol use No  . Drug use: Unknown  . Sexual activity: Not on file   Other Topics Concern  . 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    Grew up in [place]   Enjoys travel by train, reading   Completed 3 yrs of college    Past Surgical History:  Procedure Laterality Date  . ABDOMINAL HYSTERECTOMY  1955  . APPENDECTOMY  1942  . cataract  2011  . ESOPHAGOGASTRODUODENOSCOPY  01/16/14   pt reported  . FEMUR FRACTURE SURGERY     right  . NASAL SINUS SURGERY  1990   "had sinuses scraped"  . OVARIAN CYST REMOVAL  1954  . PARATHYROIDECTOMY  2001 & 2006  . TONSILLECTOMY  1936    Family History  Problem Relation Age of Onset  . Stroke Mother 45  . Kidney disease Father 2    Allergies  Allergen Reactions    . Sulfa Antibiotics Rash    Current Outpatient Prescriptions on File Prior to Visit  Medication Sig Dispense Refill  . aspirin 81 MG tablet Take 81 mg by mouth daily.    Marland Kitchen atenolol (TENORMIN) 50 MG tablet TAKE 1 TABLET DAILY 90 tablet 1  . calcium-vitamin D (OSCAL WITH D) 500-200 MG-UNIT tablet Take 1 tablet by mouth.    . docusate sodium (COLACE) 100 MG capsule Take 100 mg by mouth 2 (two) times daily.    Marland Kitchen esomeprazole (NEXIUM) 40 MG capsule Take 1 capsule (40 mg total) by mouth daily at 12 noon. (Patient taking differently: Take 40 mg by mouth every morning. ) 90 capsule 1  . hydrochlorothiazide (HYDRODIURIL) 25 MG tablet Take 1 tablet (25 mg total) by mouth daily. 90 tablet 1  . KLOR-CON M20 20 MEQ tablet TAKE 1 TABLET DAILY 90 tablet 1  . nortriptyline (PAMELOR) 10 MG capsule Take 1 capsule (10 mg total) by mouth at bedtime. 30 capsule 3  . polyethylene glycol (MIRALAX / GLYCOLAX) packet Take 17 g by mouth daily.    . simvastatin (ZOCOR) 40 MG tablet Take 1 tablet (40 mg total) by mouth daily.  14 tablet 0  . SYMAX-SL 0.125 MG SUBL PLACE 1 TABLET UNDER THE TONGUE EVERY 4 HOURS AS NEEDED FOR CRAMPING 40 each 1   No current facility-administered medications on file prior to visit.     BP 130/66   Pulse 84   Temp 98.1 F (36.7 C) (Oral)   Ht 5\' 6"  (1.676 m)   Wt 198 lb 3.2 oz (89.9 kg)   SpO2 98%   BMI 31.99 kg/m       Objective:   Physical Exam  Constitutional: Kelsey Butler appears well-developed and well-nourished.  Cardiovascular: Normal rate, regular rhythm and normal heart sounds.   No murmur heard. Pulmonary/Chest: Effort normal and breath sounds normal. No respiratory distress. Kelsey Butler has no wheezes.  Musculoskeletal:  2-3+ bilateral LE edema  Psychiatric: Kelsey Butler has a normal mood and affect. Her behavior is normal. Judgment and thought content normal.          Assessment & Plan:  Hyponatremia- chronic mild hyponatremia- likely secondary to hctz. I don't think Kelsey Butler can  tolerate coming off of the diuretic due to her LE edema.  Obtain follow up bmet, continue hctz.

## 2016-04-05 NOTE — Assessment & Plan Note (Signed)
Stable on PPI, continue same.  

## 2016-04-05 NOTE — Assessment & Plan Note (Signed)
Improved on Pamelor, continue same.

## 2016-04-05 NOTE — Progress Notes (Signed)
Pre visit review using our clinic review tool, if applicable. No additional management support is needed unless otherwise documented below in the visit note. 

## 2016-04-05 NOTE — Patient Instructions (Signed)
Please complete lab work prior to leaving.   

## 2016-04-06 ENCOUNTER — Encounter: Payer: Self-pay | Admitting: Family

## 2016-05-16 ENCOUNTER — Encounter: Payer: Self-pay | Admitting: Family

## 2016-05-16 MED ORDER — HYOSCYAMINE SULFATE SL 0.125 MG SL SUBL: SUBLINGUAL_TABLET | SUBLINGUAL | 1 refills | Status: DC

## 2016-05-16 MED ORDER — NORTRIPTYLINE HCL 10 MG PO CAPS: 10.0000 mg | ORAL_CAPSULE | Freq: Every day | ORAL | 1 refills | Status: DC

## 2016-05-16 NOTE — Telephone Encounter (Signed)
Melissa-- please see quantity for symax and advise what 90 day supply should be? Last rx by [place] was #40 x 1 refill and pt needs 90 day supply for mail order.  Nortriptyline refill sent to mail order.

## 2016-05-22 ENCOUNTER — Other Ambulatory Visit: Payer: Self-pay | Admitting: Family

## 2016-05-24 ENCOUNTER — Other Ambulatory Visit: Payer: Self-pay | Admitting: Family

## 2016-06-02 DIAGNOSIS — Z961 Presence of intraocular lens: Secondary | ICD-10-CM | POA: Diagnosis not present

## 2016-06-13 ENCOUNTER — Other Ambulatory Visit: Payer: Self-pay | Admitting: Family

## 2016-07-05 ENCOUNTER — Telehealth: Payer: Self-pay | Admitting: *Deleted

## 2016-07-05 ENCOUNTER — Encounter: Payer: Self-pay | Admitting: Family

## 2016-07-05 ENCOUNTER — Ambulatory Visit: Payer: Medicare Other | Admitting: Family

## 2016-07-05 ENCOUNTER — Ambulatory Visit (INDEPENDENT_AMBULATORY_CARE_PROVIDER_SITE_OTHER): Payer: Medicare Other | Admitting: Family

## 2016-07-05 VITALS — BP 154/91 | HR 87 | Temp 97.8°F | Resp 16 | Ht 66.0 in | Wt 197.0 lb

## 2016-07-05 DIAGNOSIS — I10 Essential (primary) hypertension: Secondary | ICD-10-CM

## 2016-07-05 DIAGNOSIS — E785 Hyperlipidemia, unspecified: Secondary | ICD-10-CM | POA: Diagnosis not present

## 2016-07-05 LAB — BASIC METABOLIC PANEL
BUN: 12 mg/dL (ref 6–23)
CO2: 29 mEq/L (ref 19–32)
Calcium: 9.5 mg/dL (ref 8.4–10.5)
Chloride: 97 mEq/L (ref 96–112)
Creatinine, Ser: 0.63 mg/dL (ref 0.40–1.20)
GFR: 95.1 mL/min (ref >60.00)
Glucose, Bld: 108 mg/dL — ABNORMAL HIGH (ref 70–99)
Potassium: 3.7 mEq/L (ref 3.5–5.1)
Sodium: 134 mEq/L — ABNORMAL LOW (ref 135–145)

## 2016-07-05 NOTE — Telephone Encounter (Signed)
-----   Message from Debbrah Alar, NP sent at 07/05/2016  1:46 PM EDT ----- Yes please. She wants to make sure they don't send her an auto-refills. Thanks! ----- Message ----- From: Ronny Flurry, CMA Sent: 07/05/2016   1:30 PM To: Debbrah Alar, NP  I don't see that this was sent today. Looks like last rx was 05/16/16. Is that what you want me to cancel?  ----- Message ----- From: Debbrah Alar, NP Sent: 07/05/2016   1:13 PM To: Consuello Bossier Verdie Wilms, CMA  Could you please contact [facility] and cancel nortriptylline refill? tks

## 2016-07-05 NOTE — Patient Instructions (Signed)
OK to remain off of nortriptyline. Complete lab work prior to leaving.

## 2016-07-05 NOTE — Telephone Encounter (Signed)
Spoke with pharmacist, Benjamine Mola at [facility] and cancelled remaining refill on nortiptyline

## 2016-07-05 NOTE — Progress Notes (Signed)
Pre visit review using our clinic review tool, if applicable. No additional management support is needed unless otherwise documented below in the visit note. 

## 2016-07-05 NOTE — Progress Notes (Signed)
Subjective:    Patient ID: Kelsey Butler, female    DOB: Sep 06, 1927, 83 y.o.   MRN: [id number]  HPI  Kelsey Butler is an 83 yr old female who presents today for follow up. She is looking forward to taking a cross country train trip this summer with her husband up to [place].   Hyperlipidemia- maintained on statin.  Lab Results  Component Value Date   CHOL 172 04/05/2016   HDL 56.10 04/05/2016   LDLCALC 82 04/05/2016   LDLDIRECT 109.0 06/05/2015   TRIG 169.0 (H) 04/05/2016   CHOLHDL 3 04/05/2016   HTN- maintained on atenolol.   BP Readings from Last 3 Encounters:  07/05/16 (!) 154/91  04/05/16 130/66  01/05/16 137/71     IBS-  She reports that she was taking nortriptyline for IBS but stopped 4 days ago. She is sleeping much better.   She uses the symax prn.  IBS symptoms stable. Felt that the the nortriptyline "Made me too active at night." Reports that she had stressful dreams. Wants to remain off.   Review of Systems See HPI  Past Medical History:  Diagnosis Date  . GERD (gastroesophageal reflux disease)   . High cholesterol   . History of colon polyps   . Hypertension   . IBS (irritable bowel syndrome)   . Osteoporosis      Social History   Social History  . Marital status: Married    Spouse name: N/A  . Number of children: N/A  . Years of education: N/A   Occupational History  . Not on file.   Social History Main Topics  . Smoking status: Never Smoker  . Smokeless tobacco: Never Used  . Alcohol use No  . Drug use: Unknown  . Sexual activity: Not on file   Other Topics Concern  . 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    Grew up in  [place]   Enjoys travel by train, reading   Completed 3 yrs of college    Past Surgical History:  Procedure Laterality Date  . ABDOMINAL HYSTERECTOMY  1955  . APPENDECTOMY  1942  . cataract  2011  . ESOPHAGOGASTRODUODENOSCOPY  01/16/14   pt reported  . FEMUR FRACTURE SURGERY     right  . NASAL SINUS SURGERY  1990   "had sinuses scraped"  . OVARIAN CYST REMOVAL  1954  . PARATHYROIDECTOMY  2001 & 2006  . TONSILLECTOMY  1936    Family History  Problem Relation Age of Onset  . Stroke Mother 96  . Kidney disease Father 63    Allergies  Allergen Reactions  . Sulfa Antibiotics Rash    Current Outpatient Prescriptions on File Prior to Visit  Medication Sig Dispense Refill  . acetaminophen (TYLENOL) 325 MG tablet Take 650 mg by mouth as needed (arthritis).    Marland Kitchen aspirin 81 MG tablet Take 81 mg by mouth daily.    Marland Kitchen atenolol (TENORMIN) 50 MG tablet TAKE 1 TABLET DAILY 90 tablet 1  . calcium-vitamin D (OSCAL WITH D) 500-200 MG-UNIT tablet Take 1 tablet by mouth.    . docusate sodium (COLACE) 100  MG capsule Take 100 mg by mouth 2 (two) times daily.    Marland Kitchen esomeprazole (NEXIUM) 40 MG capsule Take 1 capsule (40 mg total) by mouth daily at 12 noon. (Patient taking differently: Take 40 mg by mouth every morning. ) 90 capsule 1  . hydrochlorothiazide (HYDRODIURIL) 25 MG tablet TAKE 1 TABLET DAILY 90 tablet 1  . Hyoscyamine Sulfate SL (SYMAX-SL) 0.125 MG SUBL PLACE 1 TABLET UNDER THE TONGUE EVERY 4 HOURS AS NEEDED FOR CRAMPING 120 each 1  . KLOR-CON M20 20 MEQ tablet TAKE 1 TABLET DAILY 90 tablet 1  . polyethylene glycol (MIRALAX / GLYCOLAX) packet Take 17 g by mouth daily.    . simvastatin (ZOCOR) 40 MG tablet Take 1 tablet (40 mg total) by mouth daily. 14 tablet 0   No current facility-administered medications on file prior to visit.     BP (!) 154/91 (BP Location: Right Arm, Patient Position: Sitting, Cuff Size: Normal)   Pulse 87   Temp 97.8 F (36.6 C) (Oral)   Resp 16   Ht 5'  6" (1.676 m)   Wt 197 lb (89.4 kg)   SpO2 98%   BMI 31.80 kg/m       Objective:   Physical Exam  Constitutional: She is oriented to person, place, and time. She appears well-developed and well-nourished.  Cardiovascular: Normal rate, regular rhythm and normal heart sounds.   No murmur heard. Pulmonary/Chest: Effort normal and breath sounds normal. No respiratory distress. She has no wheezes.  Musculoskeletal: She exhibits no edema.  Neurological: She is alert and oriented to person, place, and time.  Psychiatric: She has a normal mood and affect. Her behavior is normal. Judgment and thought content normal.          Assessment & Plan:  IBS-stable of the nortriptyline, advised pt OK to remain off.  HTN- BP is mildly elevated today. Given her advanced age and previous blood pressures which were much lower, will not change her medications. Obtain follow up bmet, continue current meds.    Hyperlipidemia- Reasonable lipids.  Continue simvastatin.  Lab Results  Component Value Date   CHOL 172 04/05/2016   HDL 56.10 04/05/2016   LDLCALC 82 04/05/2016   LDLDIRECT 109.0 06/05/2015   TRIG 169.0 (H) 04/05/2016   CHOLHDL 3 04/05/2016

## 2016-08-13 ENCOUNTER — Other Ambulatory Visit: Payer: Self-pay | Admitting: Family

## 2016-08-15 NOTE — Telephone Encounter (Signed)
Refill sent per [facility] refill protocol/SLS LOV: 07/05/16 ROV: 6-Mths.

## 2016-08-15 NOTE — Telephone Encounter (Signed)
Refill sent per [facility] refill protocol/SLS LOV: 07/05/16 ROV: 6-Mths.

## 2016-08-17 ENCOUNTER — Other Ambulatory Visit: Payer: Self-pay | Admitting: Family

## 2016-08-17 MED ORDER — ESOMEPRAZOLE MAGNESIUM 40 MG PO CPDR: 40.0000 mg | DELAYED_RELEASE_CAPSULE | Freq: Every day | ORAL | 0 refills | Status: DC

## 2016-08-17 NOTE — Telephone Encounter (Signed)
Change in therapy as Nexium 20 mg was requested and Denied; pt taking 40 mg capsule, as reported at last office visit, new dosage sent electronically to [facility]/SLS 05/02

## 2016-08-22 NOTE — Telephone Encounter (Signed)
Below rx printed. Signed today and faxed to [facility].

## 2016-10-14 ENCOUNTER — Other Ambulatory Visit: Payer: Self-pay | Admitting: Family

## 2016-10-27 ENCOUNTER — Encounter: Payer: Self-pay | Admitting: Family

## 2016-10-27 ENCOUNTER — Other Ambulatory Visit: Payer: Self-pay

## 2016-10-27 ENCOUNTER — Telehealth: Payer: Self-pay | Admitting: Family

## 2016-10-27 ENCOUNTER — Encounter: Payer: Self-pay | Admitting: Emergency Medicine

## 2016-10-27 ENCOUNTER — Emergency Department (INDEPENDENT_AMBULATORY_CARE_PROVIDER_SITE_OTHER)
Admission: EM | Admit: 2016-10-27 | Discharge: 2016-10-27 | Disposition: A | Discharge status: Home / Self Care | Payer: Medicare Other | Source: Home / Self Care | Attending: Family Medicine | Admitting: Family Medicine

## 2016-10-27 DIAGNOSIS — R5381 Other malaise: Secondary | ICD-10-CM

## 2016-10-27 DIAGNOSIS — R5383 Other fatigue: Secondary | ICD-10-CM | POA: Diagnosis not present

## 2016-10-27 LAB — POCT CBC W AUTO DIFF (K'VILLE URGENT CARE)

## 2016-10-27 NOTE — ED Provider Notes (Signed)
Kelsey Butler C[facility]    CSN: [id number] Arrival date & time: 10/27/16  1310     History   Chief Complaint Chief Complaint  Patient presents with  . Weakness    HPI Kelsey Butler is a 83 y.o. female.   Patient reports that after eating breakfast yesterday, she felt fatigued.  At about 11am yesterday while working on her computer she felt mildly light-headed but not dizzy.  She has a history of IBS, and felt vague epigastric discomfort, somewhat different from her usual IBS symptoms.  No chest pain or shortness of breath.  No headache.  No weakness or paresthesias.  She felt mildly unsteady on her feet.  Her symptoms have persisted today, although she feels better now.  No URI symptoms.  No fevers, chills, and sweats.  No GU symptoms.   The history is provided by the patient and a relative.    Past Medical History:  Diagnosis Date  . GERD (gastroesophageal reflux disease)   . High cholesterol   . History of colon polyps   . Hypertension   . IBS (irritable bowel syndrome)   . Osteoporosis     Patient Active Problem List   Diagnosis Date Noted  . GERD (gastroesophageal reflux disease) 06/05/2015  . HTN (hypertension) 06/05/2015  . IBS (irritable bowel syndrome) 06/05/2015  . Hyperlipidemia 06/05/2015  . Vitamin D deficiency 06/05/2015  . Osteoporosis 06/05/2015  . Colon polyps 06/05/2015    Past Surgical History:  Procedure Laterality Date  . ABDOMINAL HYSTERECTOMY  1955  . APPENDECTOMY  1942  . cataract  2011  . ESOPHAGOGASTRODUODENOSCOPY  01/16/14   pt reported  . FEMUR FRACTURE SURGERY     right  . NASAL SINUS SURGERY  1990   "had sinuses scraped"  . OVARIAN CYST REMOVAL  1954  . PARATHYROIDECTOMY  2001 & 2006  . TONSILLECTOMY  1936    OB History    No data available       Home Medications    Prior to Admission medications   Medication Sig Start Date End Date Taking? Authorizing Provider  acetaminophen (TYLENOL) 325 MG tablet Take 650 mg by mouth  as needed (arthritis).    [provider]  aspirin 81 MG tablet Take 81 mg by mouth daily.    [provider]  atenolol (TENORMIN) 50 MG tablet TAKE 1 TABLET DAILY 08/15/16   Debbrah Alar, NP  calcium-vitamin D (OSCAL WITH D) 500-200 MG-UNIT tablet Take 1 tablet by mouth.    [provider]  docusate sodium (COLACE) 100 MG capsule Take 100 mg by mouth 2 (two) times daily.    [provider]  esomeprazole (NEXIUM) 40 MG capsule Take 1 capsule (40 mg total) by mouth daily. 08/17/16   Debbrah Alar, NP  hydrochlorothiazide (HYDRODIURIL) 25 MG tablet TAKE 1 TABLET DAILY 10/17/16   Debbrah Alar, NP  KLOR-CON M20 20 MEQ tablet TAKE 1 TABLET DAILY 06/14/16   Debbrah Alar, NP  polyethylene glycol (MIRALAX / GLYCOLAX) packet Take 17 g by mouth daily.    [provider]  simvastatin (ZOCOR) 40 MG tablet Take 1 tablet (40 mg total) by mouth daily. 11/25/15   Debbrah Alar, NP  SYMAX-SL 0.125 MG SUBL DISSOLVE 1 TABLET UNDER THE TONGUE EVERY 4 HOURS AS NEEDED FOR CRAMPING 08/15/16   Debbrah Alar, NP    Family History Family History  Problem Relation Age of Onset  . Stroke Mother 80  . Kidney disease Father 56    Social  History Social History  Substance Use Topics  . Smoking status: Never Smoker  . Smokeless tobacco: Never Used  . Alcohol use No     Allergies   Sulfa antibiotics   Review of Systems Review of Systems No sore throat + cough No pleuritic pain No wheezing No nasal congestion No post-nasal drainage No sinus pain/pressure No itchy/red eyes No earache No hemoptysis No SOB No fever, chills No nausea No vomiting No abdominal pain No diarrhea No urinary symptoms No skin rash + fatigue No myalgias No headache    Physical Exam Triage Vital Signs ED Triage Vitals  Enc Vitals Group     BP 10/27/16 1334 (!) 159/89     Pulse Rate 10/27/16 1334 78     Resp --      Temp 10/27/16 1334 98 F  (36.7 C)     Temp Source 10/27/16 1334 Oral     SpO2 --      Weight 10/27/16 1335 198 lb (89.8 kg)     Height --      Head Circumference --      Peak Flow --      Pain Score 10/27/16 1335 0     Pain Loc --      Pain Edu? --      Excl. in [place]? --    Orthostatic VS for the past 24 hrs:  BP- Lying Pulse- Lying BP- Sitting Pulse- Sitting BP- Standing at 0 minutes Pulse- Standing at 0 minutes  10/27/16 1436 110/80 90 130/86 90 (!) 148/93 90    Updated Vital Signs BP (!) 159/89 (BP Location: Right Arm)   Pulse 78   Temp 98 F (36.7 C) (Oral)   Wt 198 lb (89.8 kg)   BMI 31.96 kg/m   Visual Acuity Right Eye Distance:   Left Eye Distance:   Bilateral Distance:    Right Eye Near:   Left Eye Near:    Bilateral Near:     Physical Exam Nursing notes and Vital Signs reviewed. Appearance:  Patient appears stated age, and in no acute distress Eyes:  Pupils are equal, round, and reactive to light and accomodation.  Extraocular movement is intact.  Conjunctivae are not inflamed  Ears:  Canals normal.  Tympanic membranes normal.  Nose:  Mildly congested turbinates.  No sinus tenderness.    Pharynx:  Normal Neck:  Supple.  Enlarged posterior/lateral nodes are palpated bilaterally, tender to palpation on the left.  Carotids have normal upstrokes without bruits.  No thyromegaly.   Lungs:  Clear to auscultation.  Breath sounds are equal.  Moving air well. Heart:  Regular rate and rhythm without murmurs, rubs, or gallops.  Abdomen:  Nontender without masses or hepatosplenomegaly.  Bowel sounds are present.  No CVA or flank tenderness.  Extremities:  1+ edema.  Skin:  No rash present.   Neurologic:  Cranial nerves 2 through 12 are normal.  Patellar, achilles, and elbow reflexes are normal.  Cerebellar function is intact (finger-to-nose and rapid alternating hand movement).  Gait and station are normal.  Romberg negative.   UC Treatments / Results  Labs (all labs ordered are listed, but  only abnormal results are displayed) Labs Reviewed  POCT CBC W AUTO DIFF ([place]):  WBC 7.0; LY 20.5; MO 2.6; GR 76.9; Hgb 14.3; Platelets 360     EKG  EKG Interpretation  Rate:  90 BPM PR:  186 msec QT:  376 msec QTcH:  459 msec QRSD:  76  msec QRS axis:  39 degrees Interpretation:  Normal sinus rhythm; within normal limits        Radiology No results found.  Procedures Procedures (including critical care time)  Medications Ordered in UC Medications - No data to display   Initial Impression / Assessment and Plan / UC Course  I have reviewed the triage vital signs and the nursing notes.  Pertinent labs & imaging results that were available during my care of the patient were reviewed by me and considered in my medical decision making (see chart for details).    Patient has unremarkable physical exam except for tender lateral cervical adenopathy, suggesting an early viral URI. Normal EKG and CBC reassuring. If cold-like symptoms develop, try the following: Take plain guaifenesin (1200mg  extended release tabs such as Mucinex) twice daily, with plenty of water, for cough and congestion.   Get adequate rest.     Try warm salt water gargles for sore throat.  Stop all antihistamines for now, and other non-prescription cough/cold preparations. May take Delsym Cough Suppressant at bedtime for nighttime cough.    If symptoms become significantly worse during the night or over the weekend, proceed to the local emergency room.  Followup with Family Doctor in about 4 to 5 days.    Final Clinical Impressions(s) / UC Diagnoses   Final diagnoses:  Malaise and fatigue    New Prescriptions New Prescriptions   No medications on file     Kandra Nicolas, MD 10/27/16 1529

## 2016-10-27 NOTE — Telephone Encounter (Signed)
Noted and agree with the ED recommendation.

## 2016-10-27 NOTE — Discharge Instructions (Signed)
If cold-like symptoms develop, try the following: Take plain guaifenesin (1200mg  extended release tabs such as Mucinex) twice daily, with plenty of water, for cough and congestion.   Get adequate rest.     Try warm salt water gargles for sore throat.  Stop all antihistamines for now, and other non-prescription cough/cold preparations. May take Delsym Cough Suppressant at bedtime for nighttime cough.    If symptoms become significantly worse during the night or over the weekend, proceed to the local emergency room.

## 2016-10-27 NOTE — Telephone Encounter (Signed)
Patient Name: Kelsey Butler  DOB: [birth year]    Initial Comment David @ [address] of [place] - Pt is feeling dizzy, light headed, has no energy and tingly. Pt has appt in Sept 2018, but might need to be seen sooner.    Nurse Assessment  Nurse: Raphael Gibney, RN, Vanita Ingles Date/Time (Eastern Time): 10/27/2016 12:28:53 PM  Confirm and document reason for call. If symptomatic, describe symptoms. 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 Pt is dizzy and lightheaded. BP 134/92. She feels tingly in her extremities. She does not have an appt until Sept.  Does the patient have any new or worsening symptoms? ---Yes  Will a triage be completed? ---Yes  Related visit to physician within the last 2 weeks? ---No  Does the PT have any chronic conditions? (i.e. diabetes, asthma, etc.) ---Yes  List chronic conditions. ---HTN  Is this a behavioral health or substance abuse call? ---No     Guidelines    Guideline Title Affirmed Question Affirmed Notes  Dizziness - Lightheadedness SEVERE dizziness (e.g., unable to stand, requires support to walk, feels like passing out now)    Final Disposition User   Go to ED Now (or PCP triage) Raphael Gibney, RN, [place] Corona at [employer] thinks pt may need to be seen sooner than Sept. depending on what findings are for today.   Referrals  [facility] [place]   Disagree/Comply: Comply

## 2016-10-27 NOTE — Telephone Encounter (Signed)
error:[id number] ° °

## 2016-10-27 NOTE — ED Triage Notes (Signed)
Pt c/o sudden onset of weakness this am. No syncope. She had already eaten breakfast when sxs started.

## 2016-11-01 ENCOUNTER — Ambulatory Visit (HOSPITAL_BASED_OUTPATIENT_CLINIC_OR_DEPARTMENT_OTHER)
Admission: RE | Admit: 2016-11-01 | Discharge: 2016-11-01 | Disposition: A | Discharge status: Home / Self Care | Payer: Medicare Other | Source: Ambulatory Visit | Attending: Family | Admitting: Family

## 2016-11-01 ENCOUNTER — Ambulatory Visit (INDEPENDENT_AMBULATORY_CARE_PROVIDER_SITE_OTHER): Payer: Medicare Other | Admitting: Family

## 2016-11-01 ENCOUNTER — Telehealth: Payer: Self-pay | Admitting: Family

## 2016-11-01 ENCOUNTER — Encounter: Payer: Self-pay | Admitting: Family

## 2016-11-01 VITALS — BP 147/77 | HR 77 | Temp 98.0°F | Resp 16 | Ht 66.0 in | Wt 197.8 lb

## 2016-11-01 DIAGNOSIS — G8929 Other chronic pain: Secondary | ICD-10-CM | POA: Diagnosis present

## 2016-11-01 DIAGNOSIS — M1712 Unilateral primary osteoarthritis, left knee: Secondary | ICD-10-CM | POA: Diagnosis not present

## 2016-11-01 DIAGNOSIS — K581 Irritable bowel syndrome with constipation: Secondary | ICD-10-CM | POA: Diagnosis not present

## 2016-11-01 DIAGNOSIS — M899 Disorder of bone, unspecified: Secondary | ICD-10-CM | POA: Diagnosis not present

## 2016-11-01 DIAGNOSIS — M25561 Pain in right knee: Secondary | ICD-10-CM

## 2016-11-01 DIAGNOSIS — M170 Bilateral primary osteoarthritis of knee: Secondary | ICD-10-CM | POA: Diagnosis not present

## 2016-11-01 DIAGNOSIS — R5383 Other fatigue: Secondary | ICD-10-CM | POA: Diagnosis not present

## 2016-11-01 DIAGNOSIS — M810 Age-related osteoporosis without current pathological fracture: Secondary | ICD-10-CM

## 2016-11-01 DIAGNOSIS — M1711 Unilateral primary osteoarthritis, right knee: Secondary | ICD-10-CM | POA: Diagnosis not present

## 2016-11-01 DIAGNOSIS — M25562 Pain in left knee: Secondary | ICD-10-CM

## 2016-11-01 LAB — URINALYSIS, ROUTINE W REFLEX MICROSCOPIC
Bilirubin Urine: NEGATIVE
Ketones, ur: NEGATIVE
Nitrite: NEGATIVE
Specific Gravity, Urine: 1.015 (ref 1.000–1.030)
Total Protein, Urine: NEGATIVE
Urine Glucose: NEGATIVE
Urobilinogen, UA: 1 (ref 0.0–1.0)
pH: 6.5 (ref 5.0–8.0)

## 2016-11-01 LAB — CBC WITH DIFFERENTIAL/PLATELET
Basophils Absolute: 0 10*3/uL (ref 0.0–0.1)
Basophils Relative: 0.5 % (ref 0.0–3.0)
Eosinophils Absolute: 0.1 10*3/uL (ref 0.0–0.7)
Eosinophils Relative: 1.9 % (ref 0.0–5.0)
HCT: 40 % (ref 36.0–46.0)
Hemoglobin: 13.7 g/dL (ref 12.0–15.0)
Lymphocytes Relative: 20.7 % (ref 12.0–46.0)
Lymphs Abs: 1.6 10*3/uL (ref 0.7–4.0)
MCHC: 34.3 g/dL (ref 30.0–36.0)
MCV: 89.8 fl (ref 78.0–100.0)
Monocytes Absolute: 0.6 10*3/uL (ref 0.1–1.0)
Monocytes Relative: 7.8 % (ref 3.0–12.0)
Neutro Abs: 5.5 10*3/uL (ref 1.4–7.7)
Neutrophils Relative %: 69.1 % (ref 43.0–77.0)
Platelets: 370 10*3/uL (ref 150.0–400.0)
RBC: 4.45 Mil/uL (ref 3.87–5.11)
RDW: 13.2 % (ref 11.5–15.5)
WBC: 8 10*3/uL (ref 4.0–10.5)

## 2016-11-01 LAB — COMPREHENSIVE METABOLIC PANEL
ALT: 9 U/L (ref 0–35)
AST: 12 U/L (ref 0–37)
Albumin: 4.3 g/dL (ref 3.5–5.2)
Alkaline Phosphatase: 69 U/L (ref 39–117)
BUN: 13 mg/dL (ref 6–23)
CO2: 27 mEq/L (ref 19–32)
Calcium: 9.7 mg/dL (ref 8.4–10.5)
Chloride: 96 mEq/L (ref 96–112)
Creatinine, Ser: 0.81 mg/dL (ref 0.40–1.20)
GFR: 71.1 mL/min (ref >60.00)
Glucose, Bld: 103 mg/dL — ABNORMAL HIGH (ref 70–99)
Potassium: 4.2 mEq/L (ref 3.5–5.1)
Sodium: 134 mEq/L — ABNORMAL LOW (ref 135–145)
Total Bilirubin: 0.8 mg/dL (ref 0.2–1.2)
Total Protein: 6.8 g/dL (ref 6.0–8.3)

## 2016-11-01 LAB — TSH: TSH: 1.55 u[IU]/mL (ref 0.35–4.50)

## 2016-11-01 MED ORDER — LUBIPROSTONE 8 MCG PO CAPS: 8.0000 ug | ORAL_CAPSULE | Freq: Two times a day (BID) | ORAL | 1 refills | Status: DC

## 2016-11-01 NOTE — Telephone Encounter (Signed)
Can you please schedule patient for reclast infusion?

## 2016-11-01 NOTE — Progress Notes (Signed)
Subjective:    Patient ID: Kelsey Butler, female    DOB: 1928-04-12, 83 y.o.   MRN: [id number]  HPI  Kelsey Butler is an 83 yr old female who presents today for follow up.  1) Weakness- reports that she felt very light headed ast Thursday. Felt like I was going to pass out.  Went to Urgent care. Had CBC which was unremarkable. Told likely viral syndrome.  Reports that she had a recent train trip x 15  Days. Got back 2 weeks ago. Denies fever.    2) IBS- reports worsening of her constipation. Has been using miralax and colace.  Went this AM. She uses symax prn.    3) Knee pain- notes bilateral knee pain, has been using a walker recently due to the pain.   4) osteoporosis- due for reclast.    Review of Systems    see HPI  Past Medical History:  Diagnosis Date  . GERD (gastroesophageal reflux disease)   . High cholesterol   . History of colon polyps   . Hypertension   . IBS (irritable bowel syndrome)   . Osteoporosis      Social History   Social History  . Marital status: Married    Spouse name: N/A  . Number of children: N/A  . Years of education: N/A   Occupational History  . Not on file.   Social History Main Topics  . Smoking status: Never Smoker  . Smokeless tobacco: Never Used  . Alcohol use No  . Drug use: Unknown  . Sexual activity: Not on file   Other Topics Concern  . 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    Grew up in [place]   Enjoys travel by train, reading   Completed 3 yrs of college    Past Surgical History:  Procedure Laterality Date  . ABDOMINAL HYSTERECTOMY  1955  . APPENDECTOMY  1942  . cataract  2011  .  ESOPHAGOGASTRODUODENOSCOPY  01/16/14   pt reported  . FEMUR FRACTURE SURGERY     right  . NASAL SINUS SURGERY  1990   "had sinuses scraped"  . OVARIAN CYST REMOVAL  1954  . PARATHYROIDECTOMY  2001 & 2006  . TONSILLECTOMY  1936    Family History  Problem Relation Age of Onset  . Stroke Mother 26  . Kidney disease Father 6    Allergies  Allergen Reactions  . Sulfa Antibiotics Rash    Current Outpatient Prescriptions on File Prior to Visit  Medication Sig Dispense Refill  . acetaminophen (TYLENOL) 325 MG tablet Take 650 mg by mouth as needed (arthritis).    Marland Kitchen aspirin 81 MG tablet Take 81 mg by mouth daily.    Marland Kitchen atenolol (TENORMIN) 50 MG tablet TAKE 1 TABLET DAILY 90 tablet 1  . calcium-vitamin D (OSCAL WITH D) 500-200 MG-UNIT tablet Take 1 tablet by mouth.    . docusate sodium (COLACE) 100 MG capsule Take 100 mg by mouth 2 (two) times daily.    Marland Kitchen esomeprazole (NEXIUM) 40 MG capsule Take 1 capsule (40 mg total) by mouth daily. 90 capsule 0  . hydrochlorothiazide (  HYDRODIURIL) 25 MG tablet TAKE 1 TABLET DAILY 90 tablet 1  . KLOR-CON M20 20 MEQ tablet TAKE 1 TABLET DAILY 90 tablet 1  . polyethylene glycol (MIRALAX / GLYCOLAX) packet Take 17 g by mouth daily.    . simvastatin (ZOCOR) 40 MG tablet Take 1 tablet (40 mg total) by mouth daily. 14 tablet 0  . SYMAX-SL 0.125 MG SUBL DISSOLVE 1 TABLET UNDER THE TONGUE EVERY 4 HOURS AS NEEDED FOR CRAMPING 120 each 1   No current facility-administered medications on file prior to visit.     BP (!) 147/77 (BP Location: Right Arm, Cuff Size: Large)   Pulse 77   Temp 98 F (36.7 C) (Oral)   Resp 16   Ht 5\' 6"  (1.676 m)   Wt 197 lb 12.8 oz (89.7 kg)   SpO2 98%   BMI 31.93 kg/m    Objective:   Physical Exam  Constitutional: She is oriented to person, place, and time. She appears well-developed and well-nourished.  HENT:  Head: Normocephalic and atraumatic.  Right Ear: Tympanic membrane and ear canal normal.  Left Ear: Tympanic  membrane and ear canal normal.  Mouth/Throat: No oropharyngeal exudate, posterior oropharyngeal edema or posterior oropharyngeal erythema.  Eyes: Conjunctivae are normal.  Cardiovascular: Normal rate, regular rhythm and normal heart sounds.   No murmur heard. Pulmonary/Chest: Effort normal and breath sounds normal. No respiratory distress. She has no wheezes.  Musculoskeletal:  2+ bilateral LE edema  Lymphadenopathy:    She has no cervical adenopathy.  Neurological: She is alert and oriented to person, place, and time.  Skin: Skin is warm and dry.  Psychiatric: She has a normal mood and affect. Her behavior is normal. Judgment and thought content normal.          Assessment & Plan:  Fatigue- unclear etiology, check UA/Culture to rule out UTI. Check CBC, CMET, TSH to further evaluate.  Bilateral knee pain- will obtain knee x-rays and refer to sports medicine for further evaluation.   IBS- uncontrolled due to chronic constipation. Trial of amitza.  Osteoporosis- will arrange reclast infusion. Last dexa 8/16.  I would like to repeat her dexa.

## 2016-11-01 NOTE — Patient Instructions (Addendum)
Please complete lab work prior to leaving.  Begin amitiza twice daily for constipation. Complete x ray on the first floor. You will be contacted about your referral to Dr. Barbaraann Barthel.

## 2016-11-02 ENCOUNTER — Other Ambulatory Visit: Payer: Self-pay | Admitting: Family

## 2016-11-02 ENCOUNTER — Telehealth: Payer: Self-pay | Admitting: *Deleted

## 2016-11-02 DIAGNOSIS — M810 Age-related osteoporosis without current pathological fracture: Secondary | ICD-10-CM

## 2016-11-02 LAB — URINE CULTURE

## 2016-11-02 MED ORDER — ZOLEDRONIC ACID 5 MG/100ML IV SOLN: 5.0000 mg | Freq: Once | INTRAVENOUS | Status: DC

## 2016-11-02 NOTE — Telephone Encounter (Signed)
Order has been placed in EPIC. Will need to complete paper order to fax as they will not schedule appt until they receive order via fax. Order completed and placed in PCP's red folder to sign and fax back to [facility] short stay for scheduling, [phone]. Notified pt.

## 2016-11-02 NOTE — Telephone Encounter (Signed)
Also, blood work looks good. Sodium remains a little low but stable.

## 2016-11-02 NOTE — Telephone Encounter (Signed)
Notified pt and she voices understanding.   Debbrah Alar, NP 5 hours ago (9:41 AM)     Please let pt know that is reviewed her xrays. X-rays confirm arthritis. There is a spot in her right knee that appears to be a benign bone growth.  I spoke with the radiologist and he recommended that we repeat her x ray in 6-12 months to make sure that this area looks stable.  She should be contacted about her referral to sports medicine for her knee pain.       Also, blood work looks good. Sodium remains a little low but stable.

## 2016-11-02 NOTE — Telephone Encounter (Signed)
Please let pt know that is reviewed her xrays. X-rays confirm arthritis. There is a spot in her right knee that appears to be a benign bone growth.  I spoke with the radiologist and he recommended that we repeat her x ray in 6-12 months to make sure that this area looks stable.  She should be contacted about her referral to sports medicine for her knee pain.

## 2016-11-02 NOTE — Telephone Encounter (Signed)
Below results addressed in new phone note on 11/02/16.

## 2016-11-10 ENCOUNTER — Ambulatory Visit (HOSPITAL_BASED_OUTPATIENT_CLINIC_OR_DEPARTMENT_OTHER)
Admission: RE | Admit: 2016-11-10 | Discharge: 2016-11-10 | Disposition: A | Discharge status: Home / Self Care | Payer: Medicare Other | Source: Ambulatory Visit | Attending: Family | Admitting: Family

## 2016-11-10 ENCOUNTER — Encounter: Payer: Self-pay | Admitting: Family

## 2016-11-10 ENCOUNTER — Encounter (HOSPITAL_BASED_OUTPATIENT_CLINIC_OR_DEPARTMENT_OTHER): Payer: Self-pay | Admitting: Radiology

## 2016-11-10 DIAGNOSIS — M810 Age-related osteoporosis without current pathological fracture: Secondary | ICD-10-CM | POA: Diagnosis not present

## 2016-11-10 DIAGNOSIS — Z780 Asymptomatic menopausal state: Secondary | ICD-10-CM | POA: Diagnosis not present

## 2016-11-15 ENCOUNTER — Ambulatory Visit (INDEPENDENT_AMBULATORY_CARE_PROVIDER_SITE_OTHER): Payer: Medicare Other | Admitting: Family Medicine

## 2016-11-15 ENCOUNTER — Encounter: Payer: Self-pay | Admitting: Family Medicine

## 2016-11-15 DIAGNOSIS — M25562 Pain in left knee: Secondary | ICD-10-CM | POA: Diagnosis not present

## 2016-11-15 DIAGNOSIS — G8929 Other chronic pain: Secondary | ICD-10-CM | POA: Diagnosis not present

## 2016-11-15 DIAGNOSIS — M25561 Pain in right knee: Secondary | ICD-10-CM

## 2016-11-15 NOTE — Progress Notes (Signed)
PCP and consultation requested by: Debbrah Alar, NP  Subjective:   HPI: Patient is a 83 y.o. female here for bilateral knee pain.  Patient reports she's had about 6 months of bilateral anterior knee pain. Both knees [place] equally at 5/10 level, sharp. Worse in morning and with immobility. Better with movement. Taken tylenol but no other treatment. Associated swelling in legs. No skin changes, numbness.  Past Medical History:  Diagnosis Date  . GERD (gastroesophageal reflux disease)   . High cholesterol   . History of colon polyps   . Hypertension   . IBS (irritable bowel syndrome)   . Osteoporosis     Current Outpatient Prescriptions on File Prior to Visit  Medication Sig Dispense Refill  . acetaminophen (TYLENOL) 325 MG tablet Take 650 mg by mouth as needed (arthritis).    Marland Kitchen aspirin 81 MG tablet Take 81 mg by mouth daily.    Marland Kitchen atenolol (TENORMIN) 50 MG tablet TAKE 1 TABLET DAILY 90 tablet 1  . calcium-vitamin D (OSCAL WITH D) 500-200 MG-UNIT tablet Take 1 tablet by mouth.    . docusate sodium (COLACE) 100 MG capsule Take 100 mg by mouth 2 (two) times daily.    Marland Kitchen esomeprazole (NEXIUM) 40 MG capsule Take 1 capsule (40 mg total) by mouth daily. 90 capsule 0  . hydrochlorothiazide (HYDRODIURIL) 25 MG tablet TAKE 1 TABLET DAILY 90 tablet 1  . KLOR-CON M20 20 MEQ tablet TAKE 1 TABLET DAILY 90 tablet 1  . lubiprostone (AMITIZA) 8 MCG capsule Take 1 capsule (8 mcg total) by mouth 2 (two) times daily with a meal. 180 capsule 1  . polyethylene glycol (MIRALAX / GLYCOLAX) packet Take 17 g by mouth daily.    . simvastatin (ZOCOR) 40 MG tablet Take 1 tablet (40 mg total) by mouth daily. 14 tablet 0  . SYMAX-SL 0.125 MG SUBL DISSOLVE 1 TABLET UNDER THE TONGUE EVERY 4 HOURS AS NEEDED FOR CRAMPING 120 each 1   Current Facility-Administered Medications on File Prior to Visit  Medication Dose Route Frequency Provider Last Rate Last Dose  . zoledronic acid (RECLAST) injection 5 mg  5 mg  Intravenous Once Debbrah Alar, NP        Past Surgical History:  Procedure Laterality Date  . ABDOMINAL HYSTERECTOMY  1955  . APPENDECTOMY  1942  . cataract  2011  . ESOPHAGOGASTRODUODENOSCOPY  01/16/14   pt reported  . FEMUR FRACTURE SURGERY     right  . NASAL SINUS SURGERY  1990   "had sinuses scraped"  . OVARIAN CYST REMOVAL  1954  . PARATHYROIDECTOMY  2001 & 2006  . TONSILLECTOMY  1936    Allergies  Allergen Reactions  . Sulfa Antibiotics Rash    Social History   Social History  . Marital status: Married    Spouse name: N/A  . Number of children: N/A  . Years of education: N/A   Occupational History  . Not on file.   Social History Main Topics  . Smoking status: Never Smoker  . Smokeless tobacco: Never Used  . Alcohol use No  . Drug use: Unknown  . Sexual activity: Not on file   Other Topics Concern  . 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    Grew up in [place]   Enjoys travel by train, reading   Completed 3 yrs of college    Family History  Problem Relation Age of Onset  . Stroke Mother 74  . Kidney disease Father 50    Ht 5\' 6"  (1.676 m)   Wt 198 lb (89.8 kg)   BMI 31.96 kg/m   Review of Systems: See HPI above.     Objective:  Physical Exam:  Gen: NAD, comfortable in exam room  Right knee: No gross deformity, ecchymoses, effusion.  Soft tissue swelling of ankles, feet, trace pitting edema. No TTP currently. FROM. Negative ant/post drawers. Negative valgus/varus testing. Negative lachmanns. Negative mcmurrays, apleys, patellar apprehension. NV intact distally.  Left knee: No gross deformity,  ecchymoses, effusion.  Soft tissue swelling of ankles, feet, trace pitting edema. No TTP currently. FROM. Negative ant/post drawers. Negative valgus/varus testing. Negative lachmanns. Negative mcmurrays, apleys, patellar apprehension. NV intact distally.  Assessment & Plan:  1. Bilateral knee pain - Independently reviewed radiographs and noted arthritis but no other abnormalities.  Discussed tylenol, topical medications, supplements that may help, sparing use of aleve.  Declined injection.  Start physical therapy at her facility.  Heat/ice, arch supports.  Walker for support also.  F/u in 6 weeks.

## 2016-11-15 NOTE — Assessment & Plan Note (Signed)
Independently reviewed radiographs and noted arthritis but no other abnormalities.  Discussed tylenol, topical medications, supplements that may help, sparing use of aleve.  Declined injection.  Start physical therapy at her facility.  Heat/ice, arch supports.  Walker for support also.  F/u in 6 weeks.

## 2016-11-15 NOTE — Patient Instructions (Signed)
Your knee pain is due to arthritis. These are the different medications you can take for this: Tylenol 500mg  1-2 tabs three times a day for pain. Capsaicin, aspercreme, or biofreeze topically up to four times a day may also help with pain. Some supplements that may help for arthritis: Boswellia extract, curcumin, pycnogenol Aleve 1-2 tabs twice a day with food (use as last line - can irritate stomach and kidneys) Cortisone injections are an option. If cortisone injections do not help, there are different types of shots that may help but they take longer to take effect. It's important that you continue to stay active. Straight leg raises, knee extensions 3 sets of 10 once a day (add ankle weight if these become too easy). Start physical therapy to strengthen muscles around the joint that hurts to take pressure off of the joint itself - we will contact [place] to set this up. Shoe inserts with good arch support may be helpful. Heat or ice 15 minutes at a time 3-4 times a day as needed to help with pain. Water aerobics and cycling with low resistance are the best two types of exercise for arthritis though any exercise is ok as long as it doesn't worsen the pain. Follow up with me in 6 weeks.

## 2016-11-18 NOTE — Telephone Encounter (Signed)
Order faxed to Short Stay at [facility] [phone]) on 11/16/16 at 3:15pm. Spoke with [facility] and scheduled Reclast for 11/30/16 at 11am. Pt will need to register in admitting at 10:45am.

## 2016-11-18 NOTE — Addendum Note (Signed)
Addended by: Sherrie George F on: 11/18/2016 10:51 AM   Modules accepted: Orders

## 2016-11-18 NOTE — Telephone Encounter (Signed)
Kelsey Butler-- you had previously asked me to set her up for reclast and they have her scheduled on 11/30/16 but I was not able to inform her of that yet. Do we need to cancel that and proceed with Prolia instead?

## 2016-11-19 ENCOUNTER — Other Ambulatory Visit: Payer: Self-pay | Admitting: Family

## 2016-11-20 NOTE — Telephone Encounter (Signed)
Could you please initiate prolia approval?

## 2016-11-21 ENCOUNTER — Other Ambulatory Visit: Payer: Self-pay | Admitting: Family

## 2016-11-21 ENCOUNTER — Telehealth: Payer: Self-pay | Admitting: *Deleted

## 2016-11-21 DIAGNOSIS — I10 Essential (primary) hypertension: Secondary | ICD-10-CM | POA: Diagnosis not present

## 2016-11-21 DIAGNOSIS — G8929 Other chronic pain: Secondary | ICD-10-CM | POA: Diagnosis not present

## 2016-11-21 DIAGNOSIS — M810 Age-related osteoporosis without current pathological fracture: Secondary | ICD-10-CM | POA: Diagnosis not present

## 2016-11-21 DIAGNOSIS — M25562 Pain in left knee: Secondary | ICD-10-CM | POA: Diagnosis not present

## 2016-11-21 DIAGNOSIS — M25561 Pain in right knee: Secondary | ICD-10-CM | POA: Diagnosis not present

## 2016-11-21 NOTE — Telephone Encounter (Signed)
[place]-- Can you assist with this?  Debbrah Alar, NP    8:59 PM  Note    Could you please initiate prolia approval?     November 19, 2016    Reclast infusion has been cancelled.

## 2016-11-21 NOTE — Telephone Encounter (Signed)
Rx sent to pharmacy. LB 

## 2016-11-22 ENCOUNTER — Telehealth: Payer: Self-pay | Admitting: Family Medicine

## 2016-11-22 NOTE — Telephone Encounter (Signed)
Spoke to Francee Nodal and gave verbal ok per physician for physical therapy 3x a week for 2 weeks then 2x a week for 2 weeks.

## 2016-11-22 NOTE — Telephone Encounter (Signed)
Kelsey Butler with [facility] called requesting a verbal ok to see patient for physical therapy for 3x a week for 2 weeks and then 2x a week for 2 weeks   Requesting a call back at earliest convenience: [phone]

## 2016-11-22 NOTE — Telephone Encounter (Signed)
This sounds good to me!  Thanks!

## 2016-11-23 DIAGNOSIS — G8929 Other chronic pain: Secondary | ICD-10-CM | POA: Diagnosis not present

## 2016-11-23 DIAGNOSIS — M810 Age-related osteoporosis without current pathological fracture: Secondary | ICD-10-CM | POA: Diagnosis not present

## 2016-11-23 DIAGNOSIS — M25562 Pain in left knee: Secondary | ICD-10-CM | POA: Diagnosis not present

## 2016-11-23 DIAGNOSIS — M25561 Pain in right knee: Secondary | ICD-10-CM | POA: Diagnosis not present

## 2016-11-23 DIAGNOSIS — I10 Essential (primary) hypertension: Secondary | ICD-10-CM | POA: Diagnosis not present

## 2016-11-24 NOTE — Telephone Encounter (Signed)
Requested Prolia benefits °

## 2016-11-25 NOTE — Telephone Encounter (Signed)
Reclast was cancelled at [facility] on 11/21/16 as PCP is changing pt to Prolia.

## 2016-11-28 DIAGNOSIS — M25561 Pain in right knee: Secondary | ICD-10-CM | POA: Diagnosis not present

## 2016-11-28 DIAGNOSIS — M25562 Pain in left knee: Secondary | ICD-10-CM | POA: Diagnosis not present

## 2016-11-28 DIAGNOSIS — I10 Essential (primary) hypertension: Secondary | ICD-10-CM | POA: Diagnosis not present

## 2016-11-28 DIAGNOSIS — M810 Age-related osteoporosis without current pathological fracture: Secondary | ICD-10-CM | POA: Diagnosis not present

## 2016-11-28 DIAGNOSIS — G8929 Other chronic pain: Secondary | ICD-10-CM | POA: Diagnosis not present

## 2016-11-28 NOTE — Telephone Encounter (Signed)
Prolia benefits verified No PA required Deductible met 20% co-insurance should be covered by secondary  Patient may owe approximately $0 OOP

## 2016-11-30 ENCOUNTER — Ambulatory Visit (HOSPITAL_COMMUNITY): Admission: RE | Admit: 2016-11-30 | Payer: Medicare Other | Source: Ambulatory Visit

## 2016-11-30 NOTE — Telephone Encounter (Signed)
Prolia has been received and left message for pt to return my call to schedule Prolia injection.

## 2016-11-30 NOTE — Telephone Encounter (Signed)
Patient scheduled nurse visit for Tue 12/06/2016

## 2016-12-01 DIAGNOSIS — G8929 Other chronic pain: Secondary | ICD-10-CM | POA: Diagnosis not present

## 2016-12-01 DIAGNOSIS — M810 Age-related osteoporosis without current pathological fracture: Secondary | ICD-10-CM | POA: Diagnosis not present

## 2016-12-01 DIAGNOSIS — I10 Essential (primary) hypertension: Secondary | ICD-10-CM | POA: Diagnosis not present

## 2016-12-01 DIAGNOSIS — M25562 Pain in left knee: Secondary | ICD-10-CM | POA: Diagnosis not present

## 2016-12-01 DIAGNOSIS — M25561 Pain in right knee: Secondary | ICD-10-CM | POA: Diagnosis not present

## 2016-12-05 DIAGNOSIS — I10 Essential (primary) hypertension: Secondary | ICD-10-CM | POA: Diagnosis not present

## 2016-12-05 DIAGNOSIS — G8929 Other chronic pain: Secondary | ICD-10-CM | POA: Diagnosis not present

## 2016-12-05 DIAGNOSIS — M25562 Pain in left knee: Secondary | ICD-10-CM | POA: Diagnosis not present

## 2016-12-05 DIAGNOSIS — M25561 Pain in right knee: Secondary | ICD-10-CM | POA: Diagnosis not present

## 2016-12-05 DIAGNOSIS — M810 Age-related osteoporosis without current pathological fracture: Secondary | ICD-10-CM | POA: Diagnosis not present

## 2016-12-06 ENCOUNTER — Ambulatory Visit (INDEPENDENT_AMBULATORY_CARE_PROVIDER_SITE_OTHER): Payer: Medicare Other

## 2016-12-06 DIAGNOSIS — M810 Age-related osteoporosis without current pathological fracture: Secondary | ICD-10-CM

## 2016-12-06 MED ORDER — DENOSUMAB 60 MG/ML ~~LOC~~ SOLN
60.0000 mg | Freq: Once | SUBCUTANEOUS | Status: AC
  Administered 2016-12-06: 60 mg via SUBCUTANEOUS

## 2016-12-06 NOTE — Progress Notes (Signed)
Pre visit review using our clinic tool,if applicable. No additional management support is needed unless otherwise documented below in the visit note.   Patient in for initial  Prolia injection per order from M. Edwena Blow, NP due to patient having Osteoporosis.   No complaints voiced by patient today. Given Prolia 60 mg SQ . Advised she will be called for return appointment in 6 months.

## 2016-12-09 DIAGNOSIS — G8929 Other chronic pain: Secondary | ICD-10-CM | POA: Diagnosis not present

## 2016-12-09 DIAGNOSIS — M25562 Pain in left knee: Secondary | ICD-10-CM | POA: Diagnosis not present

## 2016-12-09 DIAGNOSIS — I10 Essential (primary) hypertension: Secondary | ICD-10-CM | POA: Diagnosis not present

## 2016-12-09 DIAGNOSIS — M25561 Pain in right knee: Secondary | ICD-10-CM | POA: Diagnosis not present

## 2016-12-09 DIAGNOSIS — M810 Age-related osteoporosis without current pathological fracture: Secondary | ICD-10-CM | POA: Diagnosis not present

## 2016-12-11 ENCOUNTER — Other Ambulatory Visit: Payer: Self-pay | Admitting: Family

## 2016-12-13 DIAGNOSIS — G8929 Other chronic pain: Secondary | ICD-10-CM | POA: Diagnosis not present

## 2016-12-13 DIAGNOSIS — I10 Essential (primary) hypertension: Secondary | ICD-10-CM | POA: Diagnosis not present

## 2016-12-13 DIAGNOSIS — M25562 Pain in left knee: Secondary | ICD-10-CM | POA: Diagnosis not present

## 2016-12-13 DIAGNOSIS — M810 Age-related osteoporosis without current pathological fracture: Secondary | ICD-10-CM | POA: Diagnosis not present

## 2016-12-13 DIAGNOSIS — M25561 Pain in right knee: Secondary | ICD-10-CM | POA: Diagnosis not present

## 2016-12-15 DIAGNOSIS — G8929 Other chronic pain: Secondary | ICD-10-CM | POA: Diagnosis not present

## 2016-12-15 DIAGNOSIS — M25562 Pain in left knee: Secondary | ICD-10-CM | POA: Diagnosis not present

## 2016-12-15 DIAGNOSIS — M810 Age-related osteoporosis without current pathological fracture: Secondary | ICD-10-CM | POA: Diagnosis not present

## 2016-12-15 DIAGNOSIS — I10 Essential (primary) hypertension: Secondary | ICD-10-CM | POA: Diagnosis not present

## 2016-12-15 DIAGNOSIS — M25561 Pain in right knee: Secondary | ICD-10-CM | POA: Diagnosis not present

## 2017-01-03 ENCOUNTER — Ambulatory Visit (INDEPENDENT_AMBULATORY_CARE_PROVIDER_SITE_OTHER): Payer: Medicare Other | Admitting: Family Medicine

## 2017-01-03 ENCOUNTER — Encounter: Payer: Self-pay | Admitting: Gastroenterology

## 2017-01-03 ENCOUNTER — Ambulatory Visit (INDEPENDENT_AMBULATORY_CARE_PROVIDER_SITE_OTHER): Payer: Medicare Other | Admitting: Family

## 2017-01-03 ENCOUNTER — Encounter: Payer: Self-pay | Admitting: Family

## 2017-01-03 ENCOUNTER — Encounter: Payer: Self-pay | Admitting: Family Medicine

## 2017-01-03 VITALS — BP 133/83 | HR 77 | Temp 97.7°F | Resp 20 | Ht 66.0 in | Wt 198.8 lb

## 2017-01-03 DIAGNOSIS — R5383 Other fatigue: Secondary | ICD-10-CM

## 2017-01-03 DIAGNOSIS — K589 Irritable bowel syndrome without diarrhea: Secondary | ICD-10-CM

## 2017-01-03 DIAGNOSIS — M25562 Pain in left knee: Secondary | ICD-10-CM

## 2017-01-03 DIAGNOSIS — G8929 Other chronic pain: Secondary | ICD-10-CM

## 2017-01-03 DIAGNOSIS — M25561 Pain in right knee: Secondary | ICD-10-CM

## 2017-01-03 DIAGNOSIS — Z23 Encounter for immunization: Secondary | ICD-10-CM

## 2017-01-03 NOTE — Patient Instructions (Signed)
Do the home exercises 3 times a week indefinitely. These are the different medications you can take for this: Tylenol 500mg  1-2 tabs three times a day for pain. Capsaicin, aspercreme, or biofreeze topically up to four times a day may also help with pain. Some supplements that may help for arthritis: Boswellia extract, curcumin, pycnogenol Aleve 1-2 tabs twice a day with food (use as last line - can irritate stomach and kidneys) Call me if you have any problems otherwise follow up as needed.

## 2017-01-03 NOTE — Progress Notes (Addendum)
Subjective:    Patient ID: Kelsey Butler, female    DOB: Nov 22, 1927, 82 y.o.   MRN: [id number]  HPI  Kelsey Butler is an 83 yr old female who presents today for follow up.  1) IBS- reports intermittent post prandial pain. She reports that this has been a longstanding problem.  Last visit she was given a trial of amitiza for her chronic constipation. She reports that she has had intermittent abdominal cramping Eats breakfast and then 1 hour later she develops generalized abdominal pain. She has had it before with her IBS. Then it passes.  She reports that this happens about half the time after she eats a meal. She thinks that [place] helps her move her bowels.  Moving bowels daily and using miralax once daily as well.  Feels like when she is more distracted she has less abdominal pain.  She thinks that the symax helps her cramping.    2) Fatigue- feels like fatigue is consistent with her advanced age.   Lab Results  Component Value Date   TSH 1.55 11/01/2016   Lab Results  Component Value Date   WBC 8.0 11/01/2016   HGB 13.7 11/01/2016   HCT 40.0 11/01/2016   MCV 89.8 11/01/2016   PLT 370.0 11/01/2016      Review of Systems See HPI  Past Medical History:  Diagnosis Date  . GERD (gastroesophageal reflux disease)   . High cholesterol   . History of colon polyps   . Hypertension   . IBS (irritable bowel syndrome)   . Osteoporosis      Social History   Social History  . Marital status: Married    Spouse name: N/A  . Number of children: N/A  . Years of education: N/A   Occupational History  . Not on file.   Social History Main Topics  . Smoking status: Never Smoker  . Smokeless tobacco: Never Used  . Alcohol use No  . Drug use: Unknown  . Sexual activity: Not on file   Other Topics Concern  . 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    Grew up in [place]   Enjoys travel by train, reading   Completed 3 yrs of college    Past Surgical History:  Procedure Laterality Date  . ABDOMINAL HYSTERECTOMY  1955  . APPENDECTOMY  1942  . cataract  2011  . ESOPHAGOGASTRODUODENOSCOPY  01/16/14   pt reported  . FEMUR FRACTURE SURGERY     right  . NASAL SINUS SURGERY  1990   "had sinuses scraped"  . OVARIAN CYST REMOVAL  1954  . PARATHYROIDECTOMY  2001 & 2006  . TONSILLECTOMY  1936    Family History  Problem Relation Age of Onset  . Stroke Mother 45  . Kidney disease Father 40    Allergies  Allergen Reactions  . Sulfa Antibiotics Rash    Current Outpatient Prescriptions on File Prior to Visit  Medication Sig Dispense Refill  . acetaminophen (TYLENOL) 325 MG tablet Take 650 mg by mouth as needed (arthritis).    Marland Kitchen aspirin 81 MG tablet Take 81 mg by mouth daily.    Marland Kitchen  atenolol (TENORMIN) 50 MG tablet TAKE 1 TABLET DAILY 90 tablet 1  . calcium-vitamin D (OSCAL WITH D) 500-200 MG-UNIT tablet Take 1 tablet by mouth.    . docusate sodium (COLACE) 100 MG capsule Take 100 mg by mouth 2 (two) times daily.    Marland Kitchen esomeprazole (NEXIUM) 40 MG capsule TAKE 1 CAPSULE DAILY 90 capsule 0  . hydrochlorothiazide (HYDRODIURIL) 25 MG tablet TAKE 1 TABLET DAILY 90 tablet 1  . KLOR-CON M20 20 MEQ tablet TAKE 1 TABLET DAILY 90 tablet 1  . lubiprostone (AMITIZA) 8 MCG capsule Take 1 capsule (8 mcg total) by mouth 2 (two) times daily with a meal. 180 capsule 1  . polyethylene glycol (MIRALAX / GLYCOLAX) packet Take 17 g by mouth daily.    . simvastatin (ZOCOR) 40 MG tablet TAKE 1 TABLET DAILY 90 tablet 1  . SYMAX-SL 0.125 MG SUBL DISSOLVE 1 TABLET UNDER THE TONGUE EVERY 4 HOURS AS NEEDED FOR CRAMPING 120 each 1   Current Facility-Administered  Medications on File Prior to Visit  Medication Dose Route Frequency Provider Last Rate Last Dose  . zoledronic acid (RECLAST) injection 5 mg  5 mg Intravenous Once Debbrah Alar, NP        BP 133/83 (BP Location: Right Arm, Cuff Size: Large)   Pulse 77   Temp 97.7 F (36.5 C) (Oral)   Resp 20   Ht 5\' 6"  (1.676 m)   Wt 198 lb 12.8 oz (90.2 kg)   SpO2 98%   BMI 32.09 kg/m       Objective:   Physical Exam  Constitutional: She is oriented to person, place, and time. She appears well-developed and well-nourished.  HENT:  Head: Normocephalic and atraumatic.  Cardiovascular: Normal rate, regular rhythm and normal heart sounds.   No murmur heard. Pulmonary/Chest: Effort normal and breath sounds normal. No respiratory distress. She has no wheezes.  Abdominal: Soft. Bowel sounds are normal. She exhibits no distension and no mass. There is no tenderness. There is no rebound and no guarding.  Neurological: She is alert and oriented to person, place, and time.  Psychiatric: She has a normal mood and affect. Her behavior is normal. Judgment and thought content normal.          Assessment & Plan:  Fatigue- CBC and TSH normal. Likely related to advanced age.  Monitor.   IBS- uncontrolled.  Will refer to GI for further evaluation.

## 2017-01-03 NOTE — Patient Instructions (Signed)
You will be contacted about your referral to GI.

## 2017-01-03 NOTE — Assessment & Plan Note (Signed)
Has daily symptoms. Will refer to GI for further evaluation.   Continue amitiza and miralax, symax prn.

## 2017-01-04 ENCOUNTER — Ambulatory Visit: Payer: Medicare Other | Admitting: Family

## 2017-01-04 NOTE — Progress Notes (Signed)
PCP and consultation requested by: Debbrah Alar, NP  Subjective:   HPI: Patient is a 83 y.o. female here for bilateral knee pain.  7/31: Patient reports she's had about 6 months of bilateral anterior knee pain. Both knees Buechler equally at 5/10 level, sharp. Worse in morning and with immobility. Better with movement. Taken tylenol but no other treatment. Associated swelling in legs. No skin changes, numbness.  9/18: Patient reports she feels better. Finished with physical therapy but still doing home exercises. Pain level is 0/10. Is using walker. Not taking any medicines. No swelling. No skin changes.  Past Medical History:  Diagnosis Date  . GERD (gastroesophageal reflux disease)   . High cholesterol   . History of colon polyps   . Hypertension   . IBS (irritable bowel syndrome)   . Osteoporosis     Current Outpatient Prescriptions on File Prior to Visit  Medication Sig Dispense Refill  . acetaminophen (TYLENOL) 325 MG tablet Take 650 mg by mouth as needed (arthritis).    Marland Kitchen aspirin 81 MG tablet Take 81 mg by mouth daily.    Marland Kitchen atenolol (TENORMIN) 50 MG tablet TAKE 1 TABLET DAILY 90 tablet 1  . calcium-vitamin D (OSCAL WITH D) 500-200 MG-UNIT tablet Take 1 tablet by mouth.    . docusate sodium (COLACE) 100 MG capsule Take 100 mg by mouth 2 (two) times daily.    Marland Kitchen esomeprazole (NEXIUM) 40 MG capsule TAKE 1 CAPSULE DAILY 90 capsule 0  . hydrochlorothiazide (HYDRODIURIL) 25 MG tablet TAKE 1 TABLET DAILY 90 tablet 1  . KLOR-CON M20 20 MEQ tablet TAKE 1 TABLET DAILY 90 tablet 1  . lubiprostone (AMITIZA) 8 MCG capsule Take 1 capsule (8 mcg total) by mouth 2 (two) times daily with a meal. 180 capsule 1  . polyethylene glycol (MIRALAX / GLYCOLAX) packet Take 17 g by mouth daily.    . simvastatin (ZOCOR) 40 MG tablet TAKE 1 TABLET DAILY 90 tablet 1  . SYMAX-SL 0.125 MG SUBL DISSOLVE 1 TABLET UNDER THE TONGUE EVERY 4 HOURS AS NEEDED FOR CRAMPING 120 each 1   Current  Facility-Administered Medications on File Prior to Visit  Medication Dose Route Frequency Provider Last Rate Last Dose  . zoledronic acid (RECLAST) injection 5 mg  5 mg Intravenous Once Debbrah Alar, NP        Past Surgical History:  Procedure Laterality Date  . ABDOMINAL HYSTERECTOMY  1955  . APPENDECTOMY  1942  . cataract  2011  . ESOPHAGOGASTRODUODENOSCOPY  01/16/14   pt reported  . FEMUR FRACTURE SURGERY     right  . NASAL SINUS SURGERY  1990   "had sinuses scraped"  . OVARIAN CYST REMOVAL  1954  . PARATHYROIDECTOMY  2001 & 2006  . TONSILLECTOMY  1936    Allergies  Allergen Reactions  . Sulfa Antibiotics Rash    Social History   Social History  . Marital status: Married    Spouse name: N/A  . Number of children: N/A  . Years of education: N/A   Occupational History  . Not on file.   Social History Main Topics  . Smoking status: Never Smoker  . Smokeless tobacco: Never Used  . Alcohol use No  . Drug use: Unknown  . Sexual activity: Not on file   Other Topics Concern  . 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    Grew up in [place]   Enjoys travel by train, reading   Completed 3 yrs of college    Family History  Problem Relation Age of Onset  . Stroke Mother 27  . Kidney disease Father 70    BP 127/78   Pulse 87   Ht 5\' 6"  (1.676 m)   Wt 195 lb (88.5 kg)   BMI 31.47 kg/m   Review of Systems: See HPI above.     Objective:  Physical Exam:  Gen: NAD, comfortable in exam room  Right knee: No gross deformity, ecchymoses, effusion.  Soft tissue swelling of ankles, feet, trace pitting edema. No  TTP. FROM. Negative ant/post drawers. Negative valgus/varus testing. Negative lachmanns. Negative mcmurrays, apleys, patellar apprehension. NV intact distally.  Left knee: No gross deformity, ecchymoses, effusion.  Soft tissue swelling of ankles, feet, trace pitting edema. No TTP. FROM. Negative ant/post drawers. Negative valgus/varus testing. Negative lachmanns. Negative mcmurrays, apleys, patellar apprehension. NV intact distally.   Assessment & Plan:  1. Bilateral knee pain - 2/2 arthritis.  Much improved with physical therapy, home exercises.  Continue HEP indefinitely ~3 times a week.  Tylenol, topical medications, supplements reviewed.  Consider injections if pain worsens.  Heat/ice, arch supports.  Walker for support also.  F/u prn.

## 2017-01-04 NOTE — Assessment & Plan Note (Signed)
2/2 arthritis.  Much improved with physical therapy, home exercises.  Continue HEP indefinitely ~3 times a week.  Tylenol, topical medications, supplements reviewed.  Consider injections if pain worsens.  Heat/ice, arch supports.  Walker for support also.  F/u prn.

## 2017-01-09 DIAGNOSIS — R1084 Generalized abdominal pain: Secondary | ICD-10-CM | POA: Diagnosis not present

## 2017-01-09 DIAGNOSIS — K760 Fatty (change of) liver, not elsewhere classified: Secondary | ICD-10-CM | POA: Diagnosis not present

## 2017-01-09 DIAGNOSIS — I771 Stricture of artery: Secondary | ICD-10-CM | POA: Diagnosis not present

## 2017-01-09 DIAGNOSIS — K2970 Gastritis, unspecified, without bleeding: Secondary | ICD-10-CM | POA: Diagnosis not present

## 2017-01-09 DIAGNOSIS — Z7982 Long term (current) use of aspirin: Secondary | ICD-10-CM | POA: Diagnosis not present

## 2017-01-09 DIAGNOSIS — Z79899 Other long term (current) drug therapy: Secondary | ICD-10-CM | POA: Diagnosis not present

## 2017-01-09 DIAGNOSIS — Z882 Allergy status to sulfonamides status: Secondary | ICD-10-CM | POA: Diagnosis not present

## 2017-01-09 DIAGNOSIS — E7800 Pure hypercholesterolemia, unspecified: Secondary | ICD-10-CM | POA: Diagnosis not present

## 2017-01-09 DIAGNOSIS — R10819 Abdominal tenderness, unspecified site: Secondary | ICD-10-CM | POA: Diagnosis not present

## 2017-01-09 DIAGNOSIS — I77810 Thoracic aortic ectasia: Secondary | ICD-10-CM | POA: Diagnosis not present

## 2017-01-09 DIAGNOSIS — R918 Other nonspecific abnormal finding of lung field: Secondary | ICD-10-CM | POA: Diagnosis not present

## 2017-01-09 DIAGNOSIS — E785 Hyperlipidemia, unspecified: Secondary | ICD-10-CM | POA: Diagnosis not present

## 2017-01-09 DIAGNOSIS — N281 Cyst of kidney, acquired: Secondary | ICD-10-CM | POA: Diagnosis not present

## 2017-01-09 DIAGNOSIS — R1013 Epigastric pain: Secondary | ICD-10-CM | POA: Diagnosis not present

## 2017-01-09 DIAGNOSIS — I7090 Unspecified atherosclerosis: Secondary | ICD-10-CM | POA: Diagnosis not present

## 2017-01-09 DIAGNOSIS — I10 Essential (primary) hypertension: Secondary | ICD-10-CM | POA: Diagnosis not present

## 2017-01-09 DIAGNOSIS — K828 Other specified diseases of gallbladder: Secondary | ICD-10-CM | POA: Diagnosis not present

## 2017-01-09 DIAGNOSIS — E278 Other specified disorders of adrenal gland: Secondary | ICD-10-CM | POA: Diagnosis not present

## 2017-01-09 DIAGNOSIS — M810 Age-related osteoporosis without current pathological fracture: Secondary | ICD-10-CM | POA: Diagnosis not present

## 2017-01-09 DIAGNOSIS — E279 Disorder of adrenal gland, unspecified: Secondary | ICD-10-CM | POA: Diagnosis not present

## 2017-01-09 DIAGNOSIS — K219 Gastro-esophageal reflux disease without esophagitis: Secondary | ICD-10-CM | POA: Diagnosis not present

## 2017-01-09 DIAGNOSIS — K581 Irritable bowel syndrome with constipation: Secondary | ICD-10-CM | POA: Diagnosis not present

## 2017-02-11 ENCOUNTER — Other Ambulatory Visit: Payer: Self-pay | Admitting: Family

## 2017-02-17 ENCOUNTER — Telehealth: Payer: Self-pay | Admitting: Family

## 2017-02-17 MED ORDER — HYOSCYAMINE SULFATE SL 0.125 MG SL SUBL: SUBLINGUAL_TABLET | SUBLINGUAL | 1 refills | Status: DC

## 2017-02-17 NOTE — Telephone Encounter (Signed)
Attempted to reach pt and left message to check mychart acct. Message sent. 

## 2017-02-17 NOTE — Telephone Encounter (Signed)
Please let pt know that [facility] of [facility] on Symax.  Please send to local pharmacy instead.

## 2017-02-19 ENCOUNTER — Other Ambulatory Visit: Payer: Self-pay | Admitting: Family

## 2017-02-23 ENCOUNTER — Ambulatory Visit: Payer: Medicare Other | Admitting: Gastroenterology

## 2017-03-07 ENCOUNTER — Ambulatory Visit (INDEPENDENT_AMBULATORY_CARE_PROVIDER_SITE_OTHER): Payer: Medicare Other | Admitting: Physician Assistant

## 2017-03-07 ENCOUNTER — Telehealth: Payer: Self-pay | Admitting: *Deleted

## 2017-03-07 ENCOUNTER — Encounter: Payer: Self-pay | Admitting: Physician Assistant

## 2017-03-07 ENCOUNTER — Other Ambulatory Visit: Payer: Self-pay | Admitting: *Deleted

## 2017-03-07 VITALS — BP 114/64 | HR 80 | Ht 66.0 in | Wt 200.2 lb

## 2017-03-07 DIAGNOSIS — R109 Unspecified abdominal pain: Secondary | ICD-10-CM

## 2017-03-07 DIAGNOSIS — K5730 Diverticulosis of large intestine without perforation or abscess without bleeding: Secondary | ICD-10-CM

## 2017-03-07 DIAGNOSIS — R140 Abdominal distension (gaseous): Secondary | ICD-10-CM

## 2017-03-07 DIAGNOSIS — K581 Irritable bowel syndrome with constipation: Secondary | ICD-10-CM | POA: Diagnosis not present

## 2017-03-07 MED ORDER — RIFAXIMIN 550 MG PO TABS: ORAL_TABLET | ORAL | 0 refills | Status: AC

## 2017-03-07 MED ORDER — RIFAXIMIN 550 MG PO TABS: ORAL_TABLET | ORAL | 0 refills | Status: DC

## 2017-03-07 MED ORDER — DICYCLOMINE HCL 10 MG PO CAPS: ORAL_CAPSULE | ORAL | 1 refills | Status: DC

## 2017-03-07 NOTE — Patient Instructions (Signed)
We have sent the following medications to your pharmacy for you to pick up at your convenience: Gwen Her, [place]. 1. Bentyl 10 mg 2. Xifaxan 550 mg   Continue the Amitiza 8 mcg, take 1 tab twice daily. Please let us know how the Bentyl works. If you have a problem let us know.

## 2017-03-07 NOTE — Telephone Encounter (Signed)
Received fax from [facility] asking for a Prior authorization to be done for the Xifaxan 550 mg.  I sent a prescription for Xifaxan 500 mg to [facility].  They can do the prior authorization. Once [employer] PA does her note I will fax the patient's insurance cards and Amy's office note.

## 2017-03-08 ENCOUNTER — Encounter: Payer: Self-pay | Admitting: Physician Assistant

## 2017-03-08 NOTE — Progress Notes (Addendum)
Subjective:    Patient ID: Kelsey Butler, female    DOB: [birth year], 83 y.o.   MRN: [id number]  HPI Kelsey Butler is a very nice 82 year old white female, new to GI today referred by Debbrah Alar for history of IBS and intermittent abdominal cramping. Patient has history of hypertension, GERD, colon polyps, and is status post right femur fracture. She states that she's currently living in a retirement facility and that the food choices are very [place], often fried and aggravate her IBS. She says she has had IBS-type symptoms for many years, she was seen by a gastroenterologist in [facility] 3 or 4 years ago a Dr. Syliva Overman, and had EGD and colonoscopy done. She says she has had gastric polyps and benign colon polyps as well as diverticulosis. Prior to that she had a gastroenterologist in [place], has had several colonoscopies over time with history of polyps. She does feel that her symptoms are stress related at times and have been worse recently. She complains of mid abdominal cramping type pains in general he "upset stomach". She generally tends towards constipation and is currently on Amitiza 8 g twice daily which she just started a few weeks ago. That seems to be helping and she is having now movements on a daily basis. She has not noted melena or hematochezia. She feels that everything makes her bloated and gassy. She had been given a prescription for Symax in the past and believes that was helpful., no specific food intolerances. She had CT of the abdomen and pelvis done through no vomiting in September 2018 showing a right adrenal adenoma, mild atherosclerotic disease, mild gallbladder distention of uncertain significance, no stones.  Review of Systems Pertinent positive and negative review of systems were noted in the above HPI section.  All other review of systems was otherwise negative.  Outpatient Encounter Medications as of 03/07/2017  Medication Sig  . acetaminophen  (TYLENOL) 325 MG tablet Take 650 mg by mouth as needed (arthritis).  Marland Kitchen aspirin 81 MG tablet Take 81 mg by mouth daily.  Marland Kitchen atenolol (TENORMIN) 50 MG tablet TAKE 1 TABLET DAILY  . calcium-vitamin D (OSCAL WITH D) 500-200 MG-UNIT tablet Take 1 tablet by mouth.  . docusate sodium (COLACE) 100 MG capsule Take 100 mg by mouth 2 (two) times daily.  Marland Kitchen esomeprazole (NEXIUM) 40 MG capsule TAKE 1 CAPSULE DAILY  . hydrochlorothiazide (HYDRODIURIL) 25 MG tablet TAKE 1 TABLET DAILY  . KLOR-CON M20 20 MEQ tablet TAKE 1 TABLET DAILY  . lubiprostone (AMITIZA) 8 MCG capsule Take 1 capsule (8 mcg total) by mouth 2 (two) times daily with a meal.  . polyethylene glycol (MIRALAX / GLYCOLAX) packet Take 17 g by mouth daily.  . simvastatin (ZOCOR) 40 MG tablet TAKE 1 TABLET DAILY  . dicyclomine (BENTYL) 10 MG capsule Take 1 taqb 30 min before meals fo IBS/cramping.  . [DISCONTINUED] Hyoscyamine Sulfate SL (SYMAX-SL) 0.125 MG SUBL DISSOLVE 1 TABLET UNDER THE TONGUE EVERY 4 HOURS AS NEEDED FOR CRAMPING  . [DISCONTINUED] rifaximin (XIFAXAN) 550 MG TABS tablet Take 1 tab 3 times daily for 14 days.   Facility-Administered Encounter Medications as of 03/07/2017  Medication  . zoledronic acid (RECLAST) injection 5 mg   Allergies  Allergen Reactions  . Sulfa Antibiotics Rash   Patient Active Problem List   Diagnosis Date Noted  . Bilateral knee pain 11/15/2016  . GERD (gastroesophageal reflux disease) 06/05/2015  . HTN (hypertension) 06/05/2015  . IBS (irritable bowel syndrome) 06/05/2015  . Hyperlipidemia  06/05/2015  . Vitamin D deficiency 06/05/2015  . Osteoporosis 06/05/2015  . Colon polyps 06/05/2015  . Intertrochanteric fracture of right femur ([place]) 07/19/2014   Social History   Socioeconomic History  . Marital status: Married    Spouse name: Not on file  . Number of children: Not on file  . Years of education: Not on file  . Highest education level: Not on file  Social Needs  . Financial resource  strain: Not on file  . Food insecurity - worry: Not on file  . Food insecurity - inability: Not on file  . Transportation needs - medical: Not on file  . Transportation needs - non-medical: Not on file  Occupational History  . Not on file  Tobacco Use  . Smoking status: Never Smoker  . Smokeless tobacco: Never Used  Substance and Sexual Activity  . Alcohol use: No  . Drug use: No  . Sexual activity: Not on file  Other Topics Concern  . 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    Grew up in [place]   Enjoys travel by train, reading   Completed 3 yrs of college    Ms. Wyrick's family history includes Breast cancer in her sister; Esophageal cancer in her brother; Kidney disease (age of onset: 35) in her father; Stroke (age of onset: 33) in her mother.      Objective:    Vitals:   03/07/17 0905  BP: 114/64  Pulse: 80    Physical Exam well-developed elderly female, in no acute distress, pleasant blood pressure 114/64 pulse 80, BMI 32.3. HEENT; nontraumatic normocephalic EOMI PERRLA sclera anicteric, Cardiovascular ;regular rate and rhythm with S1-S2 no murmur rub or gallop, Pulmonary; clear bilaterally, Abdomen; soft, bowel sounds present, there is no focal tenderness, no guarding or rebound no palpable mass or hepatosplenomegaly, Rectal ;exam not done, Extremities; no clubbing cyanosis or edema skin warm and dry, Neuropsych ;mood and affect appropriate       Assessment & Plan:   #37 83 year old white female with long history of IBS, manifested with constipation and intermittent abdominal cramping bloating and gas. Has recently started on Amitiza 8 g  twice a day which has helped constipation significantly but not other symptoms. #2 history of diverticulosis #3 history of "benign" colon polyps-last colonoscopy 4 years ago [facility] #4 history of gastric polyps by patient report #5 hypertension #6 GERD-stable on Nexium 40 mg by mouth every morning  Plan; Continue Amitiza 8 g by mouth twice daily MiraLAX 17 g in 8 ounces of water daily on a when necessary basis Start Bentyl 10 mg 3 times a day before meals when necessary for bloating and cramping We will also give her a trial of Xifaxan 550 mg by mouth 3 times a day 14 days as she may have a component of small intestinal bacterial overgrowth contributing to ongoing bloating and gas Patient mentions that she had been given nortriptyline several years ago which seemed to help her symptoms as well, this could be considered in the future if needed. Patient will be established with Dr. Loletha Carrow, and will follow up with Dr. Loletha Carrow or  myself in 4-6 weeks.  Addendum; records received from [facility]. EGD November 2015 with finding of antral gastritis and a gastric polyp. Biopsy/random gastric regenerative changes consistent with healing erosion or chemical gastritis no H. pylori, duodenal biopsies negative gastric polyp was a fundic gland polyp Colonoscopy November 2015 normal exam.  Alfredia Ferguson PA-C 03/08/2017   Cc: Debbrah Alar, NP

## 2017-03-08 NOTE — Telephone Encounter (Signed)
Waiting for Chart notes to be completed by Nicoletta Ba PA.

## 2017-03-08 NOTE — Telephone Encounter (Signed)
Gae Bon calling from [facility] states she is needing chart notes for [facility] sent to Fax # [phone].

## 2017-03-13 NOTE — Progress Notes (Signed)
Thank you for sending this case to me. I have reviewed the entire note, and the outlined plan seems appropriate.  However, I would avoid TCAs in an elderly patient who tends toward constipation out of concern for possible side effects. Wilfrid Lund, MD

## 2017-03-16 NOTE — Telephone Encounter (Signed)
See chart note from 03-07-2017.

## 2017-03-17 ENCOUNTER — Telehealth: Payer: Self-pay | Admitting: *Deleted

## 2017-03-17 NOTE — Telephone Encounter (Signed)
LM for the patient today. Advised the Xifaxan medication was approved by her insurance company.  He copay will be $28.00.  I LM for the patient to answer when they call. It may be an 888 number or [phone] number.

## 2017-03-20 NOTE — Telephone Encounter (Signed)
Called and the patient said [facility] called her today and wil be delivering the medication Xifaxan 550 mg to her.

## 2017-03-20 NOTE — Telephone Encounter (Signed)
Pt is calling back in regards of this asking when and where she can pick up Xifaxin best call back # [phone].

## 2017-04-05 ENCOUNTER — Encounter: Payer: Self-pay | Admitting: Physician Assistant

## 2017-04-07 ENCOUNTER — Other Ambulatory Visit: Payer: Self-pay

## 2017-04-07 MED ORDER — DICYCLOMINE HCL 10 MG PO CAPS: ORAL_CAPSULE | ORAL | 3 refills | Status: DC

## 2017-04-15 ENCOUNTER — Other Ambulatory Visit: Payer: Self-pay | Admitting: Family

## 2017-05-01 ENCOUNTER — Telehealth: Payer: Self-pay | Admitting: Family

## 2017-05-01 DIAGNOSIS — M899 Disorder of bone, unspecified: Secondary | ICD-10-CM

## 2017-05-01 NOTE — Telephone Encounter (Signed)
Notified pt and she is agreeable to complete xray. She will arrange transportation and complete as soon as she can.

## 2017-05-01 NOTE — Telephone Encounter (Signed)
Please contact pt and let her know that I reviewed her chart and it looks like she is due for a follow up x-ray of her right knee. There was an area the radiologist recommended we re-evaluate.  I have placed order.

## 2017-05-04 ENCOUNTER — Ambulatory Visit (HOSPITAL_BASED_OUTPATIENT_CLINIC_OR_DEPARTMENT_OTHER)
Admission: RE | Admit: 2017-05-04 | Discharge: 2017-05-04 | Disposition: A | Discharge status: Home / Self Care | Payer: Medicare Other | Source: Ambulatory Visit | Attending: Family | Admitting: Family

## 2017-05-04 DIAGNOSIS — M25561 Pain in right knee: Secondary | ICD-10-CM | POA: Diagnosis not present

## 2017-05-04 DIAGNOSIS — M899 Disorder of bone, unspecified: Secondary | ICD-10-CM | POA: Insufficient documentation

## 2017-05-05 ENCOUNTER — Telehealth: Payer: Self-pay | Admitting: Family

## 2017-05-05 NOTE — Telephone Encounter (Signed)
Please let pt know that I reviewed her X-ray with Dr. Barbaraann Barthel.  It shows a benign appearing bone mass.  It has been stable and this is reassuring.

## 2017-05-05 NOTE — Telephone Encounter (Signed)
Left message for pt to return my call.

## 2017-05-05 NOTE — Telephone Encounter (Signed)
Pt. Called back - given x-ray results. Verbalizes understanding.

## 2017-05-05 NOTE — Telephone Encounter (Signed)
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  A metastasis wouldn't look this way either.  It's further reassuring that your repeat x-rays show no change from the ones 6 months ago.  Sorry, I should have commented on it more specifically when I saw her last year - I didn't because the radiologist noted nothing concerning about it.  If you wanted to image it further the test would be an MRI with and without contrast.  I don't think it's necessary unless she's really concerned because of what I noted above.  ----- Message ----- From: Debbrah Alar, NP Sent: 05/05/2017   8:39 AM To: Dene Gentry, MD  Good morning Audelia Acton,  Could you please see knee x-ray on our mutual patient?  Any further thoughts or work up that you think would be prudent?  Thanks,  [employer]

## 2017-05-11 ENCOUNTER — Telehealth: Payer: Self-pay | Admitting: Family

## 2017-05-11 NOTE — Telephone Encounter (Signed)
[employer] benefits received PA not required $185 ded 20% co-insurance Covered by Supplement    Patient may owe approximately $0 OOP  Patient due after 06/04/17  Letter mailed to inform patient of benefits and to schedule

## 2017-05-20 ENCOUNTER — Other Ambulatory Visit: Payer: Self-pay | Admitting: Family

## 2017-05-22 NOTE — Telephone Encounter (Signed)
CRM created for [employer].

## 2017-06-10 ENCOUNTER — Other Ambulatory Visit: Payer: Self-pay | Admitting: Family

## 2017-06-18 ENCOUNTER — Other Ambulatory Visit: Payer: Self-pay | Admitting: Physician Assistant

## 2017-06-21 ENCOUNTER — Telehealth: Payer: Self-pay | Admitting: Physician Assistant

## 2017-06-21 NOTE — Telephone Encounter (Signed)
Spoke to Pharmacist at [facility]. She asked if they could do a 90 day supply, # [phone] with 3 refills. I advised that is ok to do. It will save the patient money per the pharmacist.

## 2017-06-27 ENCOUNTER — Ambulatory Visit (INDEPENDENT_AMBULATORY_CARE_PROVIDER_SITE_OTHER): Payer: Medicare Other | Admitting: Family

## 2017-06-27 ENCOUNTER — Encounter: Payer: Self-pay | Admitting: Family

## 2017-06-27 ENCOUNTER — Other Ambulatory Visit (HOSPITAL_COMMUNITY)
Admission: RE | Admit: 2017-06-27 | Discharge: 2017-06-27 | Disposition: A | Discharge status: Home / Self Care | Payer: Medicare Other | Source: Ambulatory Visit | Attending: Family | Admitting: Family

## 2017-06-27 ENCOUNTER — Telehealth: Payer: Self-pay | Admitting: Family

## 2017-06-27 VITALS — BP 155/85 | HR 75 | Temp 98.1°F | Resp 16 | Ht 66.0 in | Wt 199.0 lb

## 2017-06-27 DIAGNOSIS — I10 Essential (primary) hypertension: Secondary | ICD-10-CM

## 2017-06-27 DIAGNOSIS — N898 Other specified noninflammatory disorders of vagina: Secondary | ICD-10-CM | POA: Insufficient documentation

## 2017-06-27 DIAGNOSIS — M810 Age-related osteoporosis without current pathological fracture: Secondary | ICD-10-CM

## 2017-06-27 DIAGNOSIS — N761 Subacute and chronic vaginitis: Secondary | ICD-10-CM

## 2017-06-27 DIAGNOSIS — K589 Irritable bowel syndrome without diarrhea: Secondary | ICD-10-CM

## 2017-06-27 LAB — BASIC METABOLIC PANEL
BUN: 11 mg/dL (ref 6–23)
CO2: 30 mEq/L (ref 19–32)
Calcium: 9.8 mg/dL (ref 8.4–10.5)
Chloride: 96 mEq/L (ref 96–112)
Creatinine, Ser: 0.69 mg/dL (ref 0.40–1.20)
GFR: 85.42 mL/min (ref >60.00)
Glucose, Bld: 112 mg/dL — ABNORMAL HIGH (ref 70–99)
Potassium: 4.6 mEq/L (ref 3.5–5.1)
Sodium: 134 mEq/L — ABNORMAL LOW (ref 135–145)

## 2017-06-27 MED ORDER — CLOTRIMAZOLE-BETAMETHASONE 1-0.05 % EX CREA: 1.0000 "application " | TOPICAL_CREAM | Freq: Two times a day (BID) | CUTANEOUS | 0 refills | Status: DC

## 2017-06-27 MED ORDER — DENOSUMAB 60 MG/ML ~~LOC~~ SOLN
60.0000 mg | Freq: Once | SUBCUTANEOUS | Status: AC
  Administered 2017-06-27: 60 mg via SUBCUTANEOUS

## 2017-06-27 MED FILL — CLOTRIMAZOLE-BETAMETHASONE: 1-0.05 | 15 days supply | Qty: 30 | Fill #0

## 2017-06-27 NOTE — Addendum Note (Signed)
Addended by: Kelle Darting A on: 06/27/2017 04:34 PM   Modules accepted: Orders

## 2017-06-27 NOTE — Telephone Encounter (Signed)
See 05/11/17 phone note. Will give to pt today.

## 2017-06-27 NOTE — Telephone Encounter (Signed)
Patient is due for follow up prolia. Could you please arrange.

## 2017-06-27 NOTE — Progress Notes (Signed)
Subjective:    Patient ID: Kelsey Butler, female    DOB: 11-08-1927, 84 y.o.   MRN: [id number]  HPI   Patient is an 84 yr old female who presents today for follow up.  1) IBS- started amitiza and dicyclomine. Took Xifaxan which helped.  Then it "wore off."    2) Vaginitis- reports + vaginal itching, "like heat rash."  Using vagisil or hydrocortisone with good relief.  Denies disease.    3) Skin problem-  Reports red rash noted on left foot.    Review of Systems Past Medical History:  Diagnosis Date  . Arthritis   . Diverticulosis   . GERD (gastroesophageal reflux disease)   . High cholesterol   . History of colon polyps   . Hypertension   . IBS (irritable bowel syndrome)   . Osteoporosis      Social History   Socioeconomic History  . Marital status: Married    Spouse name: Not on file  . Number of children: Not on file  . Years of education: Not on file  . Highest education level: Not on file  Social Needs  . Financial resource strain: Not on file  . Food insecurity - worry: Not on file  . Food insecurity - inability: Not on file  . Transportation needs - medical: Not on file  . Transportation needs - non-medical: Not on file  Occupational History  . Not on file  Tobacco Use  . Smoking status: Never Smoker  . Smokeless tobacco: Never Used  Substance and Sexual Activity  . Alcohol use: No  . Drug use: No  . Sexual activity: Not on file  Other Topics Concern  . 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    Grew up in [place]   Enjoys travel by train, reading   Completed 3 yrs of college    Past Surgical History:  Procedure Laterality  Date  . ABDOMINAL HYSTERECTOMY  1955  . APPENDECTOMY  1942  . CATARACT EXTRACTION, BILATERAL    . ESOPHAGOGASTRODUODENOSCOPY  01/16/14   pt reported  . FEMUR FRACTURE SURGERY     right  . NASAL SINUS SURGERY  1990   "had sinuses scraped"  . OVARIAN CYST REMOVAL  1954  . PARATHYROIDECTOMY  2001 & 2006  . TONSILLECTOMY  1936    Family History  Problem Relation Age of Onset  . Stroke Mother 51  . Kidney disease Father 67  . Breast cancer Sister   . Esophageal cancer Brother     Allergies  Allergen Reactions  . Sulfa Antibiotics Rash    Current Outpatient Medications on File Prior to Visit  Medication Sig Dispense Refill  . acetaminophen (TYLENOL) 325 MG tablet Take 650 mg by mouth as needed (arthritis).    . AMITIZA 8 MCG capsule TAKE 1 CAPSULE TWICE A DAY WITH MEALS 180 capsule 1  . aspirin 81 MG tablet Take 81 mg by mouth daily.    Marland Kitchen atenolol (TENORMIN) 50 MG tablet TAKE 1 TABLET DAILY 90 tablet 1  . calcium-vitamin D (OSCAL WITH D) 500-200 MG-UNIT tablet Take 1 tablet by mouth.    Marland Kitchen  dicyclomine (BENTYL) 10 MG capsule TAKE 1 CAPSULE 30 MINUTES BEFORE MEALS FOR IRRITABLE BOWEL SYNDROME/CRAMPING 90 capsule 3  . docusate sodium (COLACE) 100 MG capsule Take 100 mg by mouth 2 (two) times daily.    Marland Kitchen esomeprazole (NEXIUM) 40 MG capsule TAKE 1 CAPSULE DAILY 90 capsule 1  . hydrochlorothiazide (HYDRODIURIL) 25 MG tablet TAKE 1 TABLET DAILY 90 tablet 1  . KLOR-CON M20 20 MEQ tablet TAKE 1 TABLET DAILY 90 tablet 1  . polyethylene glycol (MIRALAX / GLYCOLAX) packet Take 17 g by mouth daily.    . simvastatin (ZOCOR) 40 MG tablet TAKE 1 TABLET DAILY 90 tablet 1   Current Facility-Administered Medications on File Prior to Visit  Medication Dose Route Frequency Provider Last Rate Last Dose  . zoledronic acid (RECLAST) injection 5 mg  5 mg Intravenous Once Debbrah Alar, NP        BP (!) 163/69 (BP Location: Right Arm, Patient Position: Sitting, Cuff Size: Large)   Pulse 75    Temp 98.1 F (36.7 C) (Oral)   Resp 16   Ht 5\' 6"  (1.676 m)   Wt 199 lb (90.3 kg)   SpO2 99%   BMI 32.12 kg/m       Objective:   Physical Exam  Constitutional: She is oriented to person, place, and time. She appears well-developed and well-nourished.  HENT:  Head: Normocephalic and atraumatic.  Cardiovascular: Normal rate, regular rhythm and normal heart sounds.  No murmur heard. Pulmonary/Chest: Effort normal and breath sounds normal. No respiratory distress. She has no wheezes.  Genitourinary:  Genitourinary Comments: Vaginal tissues are very dry/atrophic  Musculoskeletal: She exhibits no edema.  Neurological: She is alert and oriented to person, place, and time.  Skin: Skin is warm and dry.  Large dry appearing erythematous rash noted left dorsomedial foot  Psychiatric: She has a normal mood and affect. Her behavior is normal. Judgment and thought content normal.          Assessment & Plan:  Dermatitis- ? Fungal versus eczema. rx with lotrisone.  Vaginitis- suspect atrophic vaginitis. Will send swab for yeast and BV. Discussed use of replens vaginal moisturizer.    IBS- still symptomatic. Advised pt to arrange follow up with GI for ongoing management.   Osteoporosis- due for follow up prolia.  Will arrange.

## 2017-06-27 NOTE — Patient Instructions (Addendum)
Please schedule follow up with GI to further discuss your IBS issues. Begin lotrisone cream twice daily for the spot on your foot. We will let you know how your vaginal swab turns out.  You can try over the counter "replens" vaginal lubricant. This may help with your symptoms.  Complete lab work prior to leaving

## 2017-06-28 LAB — CERVICOVAGINAL ANCILLARY ONLY
Bacterial vaginitis: NEGATIVE
Candida vaginitis: NEGATIVE

## 2017-06-29 ENCOUNTER — Encounter: Payer: Self-pay | Admitting: Family

## 2017-07-04 ENCOUNTER — Ambulatory Visit: Payer: Medicare Other | Admitting: Family

## 2017-07-18 ENCOUNTER — Encounter: Payer: Self-pay | Admitting: Physician Assistant

## 2017-07-18 ENCOUNTER — Ambulatory Visit (INDEPENDENT_AMBULATORY_CARE_PROVIDER_SITE_OTHER): Payer: Medicare Other | Admitting: Physician Assistant

## 2017-07-18 VITALS — BP 128/76 | HR 76 | Ht 66.0 in | Wt 199.0 lb

## 2017-07-18 DIAGNOSIS — K581 Irritable bowel syndrome with constipation: Secondary | ICD-10-CM | POA: Diagnosis not present

## 2017-07-18 DIAGNOSIS — K219 Gastro-esophageal reflux disease without esophagitis: Secondary | ICD-10-CM

## 2017-07-18 MED ORDER — AMITRIPTYLINE HCL 25 MG PO TABS: ORAL_TABLET | ORAL | 2 refills | Status: DC

## 2017-07-18 MED ORDER — HYOSCYAMINE SULFATE SL 0.125 MG SL SUBL: SUBLINGUAL_TABLET | SUBLINGUAL | 2 refills | Status: DC

## 2017-07-18 NOTE — Progress Notes (Signed)
Subjective:    Patient ID: Kelsey Butler, female    DOB: 26-Dec-1927, 84 y.o.   MRN: [id number]  HPI Kelsey Butler is a very nice 84 year old white female, known to Dr. Loletha Butler and myself.  She has history of hypertension, GERD, IBS, colon polyps, diverticulosis, and osteoporosis. She was last seen in the office in November 2018 as a new patient.  She had just relocated to [facility] and had moved into a retirement center.  She had related that the food was very different than what she was used to and felt this had aggravated her IBS.  She was already on Amitiza 8 mcg twice a day which was helping her constipation and this was continued.  We added Bentyl 10 mg 3 times daily for bloating and cramping, and MiraLAX on an as-needed basis. She had mentioned that she had been given nortriptyline several years ago which seemed to help her symptoms, and could not recall why she had come off of this. She comes in today for follow-up.  We did get her old records from [place] had EGD in 2015 with finding of antral gastritis and a gastric polyp, random gastric biopsies showed regenerative changes with healing erosion or chemical gastritis no H. pylori duodenal biopsies were negative the polyp was found to be a fundic gland polyp.  Colonoscopy in 2015 was normal. She says that overall she has been doing fairly well but has days that she feels that the Bentyl "is not enough".  She will have episodes of fairly bad abdominal cramping and discomfort associated with gas and bloating that may last for several hours.  Interestingly she usually feels better with some food on her stomach.  She is trying her best to avoid her trigger foods and avoid fried and greasy foods.  Review of Systems Pertinent positive and negative review of systems were noted in the above HPI section.  All other review of systems was otherwise negative.  Outpatient Encounter Medications as of 07/18/2017  Medication Sig  . acetaminophen  (TYLENOL) 325 MG tablet Take 650 mg by mouth as needed (arthritis).  . AMITIZA 8 MCG capsule TAKE 1 CAPSULE TWICE A DAY WITH MEALS  . aspirin 81 MG tablet Take 81 mg by mouth daily.  Marland Kitchen atenolol (TENORMIN) 50 MG tablet TAKE 1 TABLET DAILY  . calcium-vitamin D (OSCAL WITH D) 500-200 MG-UNIT tablet Take 1 tablet by mouth.  . clotrimazole-betamethasone (LOTRISONE) cream Apply 1 application topically 2 (two) times daily.  Marland Kitchen dicyclomine (BENTYL) 10 MG capsule TAKE 1 CAPSULE 30 MINUTES BEFORE MEALS FOR IRRITABLE BOWEL SYNDROME/CRAMPING  . docusate sodium (COLACE) 100 MG capsule Take 100 mg by mouth 2 (two) times daily.  Marland Kitchen esomeprazole (NEXIUM) 40 MG capsule TAKE 1 CAPSULE DAILY  . hydrochlorothiazide (HYDRODIURIL) 25 MG tablet TAKE 1 TABLET DAILY  . KLOR-CON M20 20 MEQ tablet TAKE 1 TABLET DAILY  . polyethylene glycol (MIRALAX / GLYCOLAX) packet Take 17 g by mouth daily.  . simvastatin (ZOCOR) 40 MG tablet TAKE 1 TABLET DAILY  . amitriptyline (ELAVIL) 25 MG tablet Take 1 tablet , 25 mg, by mouth at bedtime  . Hyoscyamine Sulfate SL (LEVSIN/SL) 0.125 MG SUBL Dissolve 1 tablet on the tongue every 6 hours as needed for abdominal pain.  . [DISCONTINUED] amitriptyline (ELAVIL) 25 MG tablet Take 1 tablet , 25 mg, by mouth at bedtime  . [DISCONTINUED] Hyoscyamine Sulfate SL (LEVSIN/SL) 0.125 MG SUBL Dissolve 1 tablet on the tongue every 6 hours as needed for abdominal  pain.   No facility-administered encounter medications on file as of 07/18/2017.    Allergies  Allergen Reactions  . Sulfa Antibiotics Rash   Patient Active Problem List   Diagnosis Date Noted  . Bilateral knee pain 11/15/2016  . GERD (gastroesophageal reflux disease) 06/05/2015  . HTN (hypertension) 06/05/2015  . IBS (irritable bowel syndrome) 06/05/2015  . Hyperlipidemia 06/05/2015  . Vitamin D deficiency 06/05/2015  . Osteoporosis 06/05/2015  . Colon polyps 06/05/2015  . Intertrochanteric fracture of right femur ([place]) 07/19/2014    Social History   Socioeconomic History  . Marital status: Married    Spouse name: Not on file  . Number of children: Not on file  . Years of education: Not on file  . Highest education level: Not on file  Occupational History  . Not on file  Social Needs  . Financial resource strain: Not on file  . Food insecurity:    Worry: Not on file    Inability: Not on file  . Transportation needs:    Medical: Not on file    Non-medical: Not on file  Tobacco Use  . Smoking status: Never Smoker  . Smokeless tobacco: Never Used  Substance and Sexual Activity  . Alcohol use: No  . Drug use: No  . Sexual activity: Not on file  Lifestyle  . Physical activity:    Days per week: Not on file    Minutes per session: Not on file  . Stress: Not on file  Relationships  . Social connections:    Talks on phone: Not on file    Gets together: Not on file    Attends religious service: Not on file    Active member of club or organization: Not on file    Attends meetings of clubs or organizations: Not on file    Relationship status: Not on file  . Intimate partner violence:    Fear of current or ex partner: Not on file    Emotionally abused: Not on file    Physically abused: Not on file    Forced sexual activity: Not on file  Other Topics Concern  . 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    Grew up in [place]   Enjoys travel by train, reading   Completed 3 yrs of college    Ms. Earll's family history includes Breast cancer in her sister; Esophageal cancer in her brother; Kidney disease (age of onset: 38) in her father; Stroke (age of onset: 28) in her  mother.      Objective:    Vitals:   07/18/17 0959  BP: 128/76  Pulse: 76    Physical Exam; very nice elderly white female in no acute distress, she ambulates with a walker.  Blood pressure 128/76 pulse 76, height 5 foot 6, weight 199, BMI 32.1.  HEENT ;nontraumatic normocephalic EOMI PERRLA sclera anicteric, Cardiovascular; regular rate and rhythm with S1-S2 no murmur rub or gallop, Pulmonary ;clear bilaterally, Abdomen ;soft, no focal tenderness no guarding or rebound no palpable mass or hepatosplenomegaly she has low midline incisional scars, Rectal; exam not done, Extremities;no clubbing cyanosis or edema skin  warm and dry, Neuro psych; mood and affect appropriate       Assessment & Plan:   #25 84 year old white female with long history of IBS/constipation predominant with periodic abdominal pain and cramping despite regular use of dicyclomine #2 diverticulosis #3.  GERD #4.  Osteoporosis #5.  Hypertension #6 history of fundic gland polyps  ; Plan continue Amitiza 8 mcg twice daily Continue MiraLAX 17 g in 8 ounces of water daily Continue Nexium 40 mg every morning  Refill Bentyl 10 mg p.o. 3 times daily AC We will add trial of Levsin sublingual dissolve one on the tongue every 6 hours as needed abdominal pain/cramping We also discussed resuming a trial of amitriptyline which she is in favor of.  Will start with 25 mg p.o. nightly, and can gradually increase the dose as needed. I have asked her to call back in about a month with a progress report.  That time if she still having symptoms may we may increase amitriptyline to 50 mg p.o. nightly.    Marysol Wellnitz Genia Harold PA-C 07/18/2017   Cc: Debbrah Alar, NP

## 2017-07-18 NOTE — Patient Instructions (Signed)
We sent two prescriptions to [facility]. 1. Levsin SL 2. Amitriptylene 25 mg  Continue Bentyl 10 mg- take 1 tab by mouth 3 times daily.   Call with a progress report in one month- ask for Amy's nurse [name].  If you are age 81 or older, your body mass index should be between 23-30. Your Body mass index is 32.12 kg/m. If this is out of the aforementioned range listed, please consider follow up with your Primary Care Provider.

## 2017-07-19 NOTE — Progress Notes (Signed)
Thank you for sending this case to me, and for discussing it with me today. I have reviewed the entire note, and the outlined plan seems appropriate.  I am concerned about the possible cumulative anti-cholinergic side effects of this regimen in an 84 year-old patient.  A trial of low dose amitriptyline is reasonable, but please have her use the levsin as needed and discontinue the dicyclomine altogether. Please also make arrangements for her to see me next in clinic in 4-6 weeks.  Wilfrid Lund, MD

## 2017-07-24 ENCOUNTER — Telehealth: Payer: Self-pay

## 2017-07-24 NOTE — Telephone Encounter (Signed)
-----   Message from Alfredia Ferguson, PA-C sent at 07/21/2017 11:52 AM EDT ----- Regarding: [name]  [name], please call pt and see if she started levsin yet ? Or amitrip tyline  ?Marland Kitchen. DR Danis concerned about her getting too much anticholinergic effect with bentyl, Levsin and amitriplyine.  Would like her to try the amitriptyline , try levsin q 6 hours as needed only, and leave off bentyl  For now  Also offer follow up Dr Loletha Carrow 6 weeks

## 2017-07-24 NOTE — Telephone Encounter (Signed)
Patient has not received her mail order medications. She has not had an opportunity to try either medication.

## 2017-08-12 ENCOUNTER — Other Ambulatory Visit: Payer: Self-pay | Admitting: Family

## 2017-08-19 ENCOUNTER — Other Ambulatory Visit: Payer: Self-pay | Admitting: Family

## 2017-09-06 ENCOUNTER — Ambulatory Visit: Payer: Medicare Other | Admitting: Gastroenterology

## 2017-09-12 ENCOUNTER — Ambulatory Visit (INDEPENDENT_AMBULATORY_CARE_PROVIDER_SITE_OTHER): Payer: Medicare Other | Admitting: Gastroenterology

## 2017-09-12 ENCOUNTER — Encounter: Payer: Self-pay | Admitting: Gastroenterology

## 2017-09-12 VITALS — BP 118/72 | HR 84 | Ht 66.0 in | Wt 198.0 lb

## 2017-09-12 DIAGNOSIS — K5909 Other constipation: Secondary | ICD-10-CM | POA: Diagnosis not present

## 2017-09-12 DIAGNOSIS — R1030 Lower abdominal pain, unspecified: Secondary | ICD-10-CM

## 2017-09-12 NOTE — Patient Instructions (Addendum)
If you are age 82 or older, your body mass index should be between 23-30. Your Body mass index is 31.96 kg/m. If this is out of the aforementioned range listed, please consider follow up with your Primary Care Provider.  If you are age 91 or younger, your body mass index should be between 19-25. Your Body mass index is 31.96 kg/m. If this is out of the aformentioned range listed, please consider follow up with your Primary Care Provider.   Continue amitiza twice daily Continue miralax  (one half to one full capful) in a glass of liquid once daily Continue ducosate twice daily If you have amitriptyline at home, stop taking it.  You can take EITHER dicyclomine OR hyoscyamine as-needed     for abdominal cramps.  No more than three times daily.  It was a pleasure to see you today!  Dr. Loletha Carrow

## 2017-09-12 NOTE — Progress Notes (Signed)
[facility] Progress Note  Chief Complaint: Chronic constipation  Subjective  History:  Kelsey Butler follows up for her long-standing constipation and lower abdominal pain.  She has a history of IBS-C, last seen by our PA Amy early April.  Kelsey Butler seems to have some difficulty keeping her medicines straight.  She does seem certain that she takes amities a twice daily, MiraLAX once daily, and what may be ducosate as well.  With that, she typically has 1 BM in the morning that is semi-formed and she feels is a good evacuation.  She is taking dicyclomine before every meal, does not think she ever took the amitriptyline prescribed at last visit, and is uncertain if she takes hyoscyamine. She denies rectal bleeding, her appetite is generally good, though she does not always care for the food at her facility.  She denies vomiting or weight loss.  ROS: Cardiovascular:  no chest pain Respiratory: no dyspnea Chronic arthralgias Peripheral edema  The patient's Past Medical, Family and Social History were reviewed and are on file in the EMR.  Objective:  Med list reviewed  Current Outpatient Medications:  .  acetaminophen (TYLENOL) 325 MG tablet, Take 650 mg by mouth as needed (arthritis)., Disp: , Rfl:  .  AMITIZA 8 MCG capsule, TAKE 1 CAPSULE TWICE A DAY WITH MEALS, Disp: 180 capsule, Rfl: 1 .  aspirin 81 MG tablet, Take 81 mg by mouth daily., Disp: , Rfl:  .  atenolol (TENORMIN) 50 MG tablet, TAKE 1 TABLET DAILY, Disp: 90 tablet, Rfl: 1 .  calcium-vitamin D (OSCAL WITH D) 500-200 MG-UNIT tablet, Take 1 tablet by mouth., Disp: , Rfl:  .  clotrimazole-betamethasone (LOTRISONE) cream, Apply 1 application topically 2 (two) times daily., Disp: 30 g, Rfl: 0 .  dicyclomine (BENTYL) 10 MG capsule, TAKE 1 CAPSULE 30 MINUTES BEFORE MEALS FOR IRRITABLE BOWEL SYNDROME/CRAMPING, Disp: 90 capsule, Rfl: 3 .  docusate sodium (COLACE) 100 MG capsule, Take 100 mg by mouth 2 (two) times daily., Disp: , Rfl:  .   esomeprazole (NEXIUM) 40 MG capsule, TAKE 1 CAPSULE DAILY, Disp: 90 capsule, Rfl: 1 .  hydrochlorothiazide (HYDRODIURIL) 25 MG tablet, TAKE 1 TABLET DAILY, Disp: 90 tablet, Rfl: 1 .  KLOR-CON M20 20 MEQ tablet, TAKE 1 TABLET DAILY, Disp: 90 tablet, Rfl: 1 .  polyethylene glycol (MIRALAX / GLYCOLAX) packet, Take 17 g by mouth daily., Disp: , Rfl:  .  simvastatin (ZOCOR) 40 MG tablet, TAKE 1 TABLET DAILY, Disp: 90 tablet, Rfl: 1   Vital signs in last 24 hrs: Vitals:   09/12/17 1359  BP: 118/72  Pulse: 84    Physical Exam  Well-appearing elderly woman, antalgic gait, gets on exam table with a little assistance  HEENT: sclera anicteric, oral mucosa moist without lesions  Neck: supple, no thyromegaly, JVD or lymphadenopathy  Cardiac: RRR without murmurs, S1S2 heard, no peripheral edema  Pulm: clear to auscultation bilaterally, normal RR and effort noted  Abdomen: soft, no tenderness, with active bowel sounds. No guarding or palpable hepatosplenomegaly.  Skin; warm and dry, no jaundice or rash  No recent data   @ASSESSMENTPLANBEGIN @ Assessment: Encounter Diagnoses  Name Primary?  . Chronic constipation Yes  . Lower abdominal pain    She has stable IBS-C.  I would like to simplify her regimen and decrease chance of anticholinergic side effects.  All of this was written down for her so she can compare it to her med supply at home.   Plan: Continue Amitiza 8 mcg  twice daily Continue her ducosate once daily Continue MiraLAX anywhere from a half to a full capful daily in the afternoon  Use either dicyclomine or hyoscyamine as needed for abdominal cramps, no more than 3 times daily.  I have told her she may need to have discomfort at times so medicines can be used as little as possible, especially given the possibility of anticholinergic side effects. Do not take amitriptyline if she does happen to have it at home.   I will see her as needed.   Total time 20 minutes, over  half spent face-to-face with patient in counseling and coordination of care.   Nelida Meuse III

## 2017-09-27 ENCOUNTER — Encounter: Payer: Self-pay | Admitting: Family

## 2017-09-27 ENCOUNTER — Ambulatory Visit (INDEPENDENT_AMBULATORY_CARE_PROVIDER_SITE_OTHER): Payer: Medicare Other | Admitting: Family

## 2017-09-27 VITALS — BP 150/74 | HR 80 | Temp 98.2°F | Resp 18 | Ht 66.0 in | Wt 198.4 lb

## 2017-09-27 DIAGNOSIS — I10 Essential (primary) hypertension: Secondary | ICD-10-CM | POA: Diagnosis not present

## 2017-09-27 DIAGNOSIS — E785 Hyperlipidemia, unspecified: Secondary | ICD-10-CM | POA: Diagnosis not present

## 2017-09-27 DIAGNOSIS — K219 Gastro-esophageal reflux disease without esophagitis: Secondary | ICD-10-CM | POA: Diagnosis not present

## 2017-09-27 DIAGNOSIS — H547 Unspecified visual loss: Secondary | ICD-10-CM

## 2017-09-27 LAB — LIPID PANEL
Cholesterol: 191 mg/dL (ref 0–200)
HDL: 53.1 mg/dL (ref >39.00)
LDL Cholesterol: 101 mg/dL — ABNORMAL HIGH (ref 0–99)
NonHDL: 138.35
Total CHOL/HDL Ratio: 4
Triglycerides: 185 mg/dL — ABNORMAL HIGH (ref 0.0–149.0)
VLDL: 37 mg/dL (ref 0.0–40.0)

## 2017-09-27 LAB — BASIC METABOLIC PANEL
BUN: 14 mg/dL (ref 6–23)
CO2: 28 mEq/L (ref 19–32)
Calcium: 9.7 mg/dL (ref 8.4–10.5)
Chloride: 96 mEq/L (ref 96–112)
Creatinine, Ser: 0.74 mg/dL (ref 0.40–1.20)
GFR: 78.75 mL/min (ref >60.00)
Glucose, Bld: 102 mg/dL — ABNORMAL HIGH (ref 70–99)
Potassium: 4.4 mEq/L (ref 3.5–5.1)
Sodium: 134 mEq/L — ABNORMAL LOW (ref 135–145)

## 2017-09-27 NOTE — Patient Instructions (Signed)
Please complete lab work prior to leaving.   

## 2017-09-27 NOTE — Progress Notes (Signed)
Subjective:    Patient ID: Kelsey Butler, female    DOB: 1927-06-02, 84 y.o.   MRN: [id number]  HPI   HTN- mainatined on hctz, atenolol BP Readings from Last 3 Encounters:  09/27/17 (!) 150/74  09/12/17 118/72  07/18/17 128/76   Hyperlipidemia- maintained on simvastatin.  Denies myalgia Lab Results  Component Value Date   CHOL 172 04/05/2016   HDL 56.10 04/05/2016   LDLCALC 82 04/05/2016   LDLDIRECT 109.0 06/05/2015   TRIG 169.0 (H) 04/05/2016   CHOLHDL 3 04/05/2016   GERD- report symptoms stable on nexium.    Vision problem- has been to several optometrist and her eyeglass prescription has not been accurate. Would like referral to ophthalmology.   Review of Systems See HPI  Past Medical History:  Diagnosis Date  . Arthritis   . Diverticulosis   . GERD (gastroesophageal reflux disease)   . High cholesterol   . History of colon polyps   . Hypertension   . IBS (irritable bowel syndrome)   . Osteoporosis      Social History   Socioeconomic History  . Marital status: Married    Spouse name: Not on file  . Number of children: Not on file  . Years of education: Not on file  . Highest education level: Not on file  Occupational History  . Not on file  Social Needs  . Financial resource strain: Not on file  . Food insecurity:    Worry: Not on file    Inability: Not on file  . Transportation needs:    Medical: Not on file    Non-medical: Not on file  Tobacco Use  . Smoking status: Never Smoker  . Smokeless tobacco: Never Used  Substance and Sexual Activity  . Alcohol use: No  . Drug use: No  . Sexual activity: Not on file  Lifestyle  . Physical activity:    Days per week: Not on file    Minutes per session: Not on file  . Stress: Not on file  Relationships  . Social connections:    Talks on phone: Not on file    Gets together: Not on file    Attends religious service: Not on file    Active member of club or organization: Not on file    Attends  meetings of clubs or organizations: Not on file    Relationship status: Not on file  . Intimate partner violence:    Fear of current or ex partner: Not on file    Emotionally abused: Not on file    Physically abused: Not on file    Forced sexual activity: Not on file  Other Topics Concern  . 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    Grew up in [place]   Enjoys travel by train, reading   Completed 3 yrs of college    Past Surgical History:  Procedure Laterality Date  . ABDOMINAL HYSTERECTOMY  1955  . APPENDECTOMY  1942  . CATARACT EXTRACTION, BILATERAL    . ESOPHAGOGASTRODUODENOSCOPY  01/16/14   pt reported  . FEMUR FRACTURE SURGERY     right  .  NASAL SINUS SURGERY  1990   "had sinuses scraped"  . OVARIAN CYST REMOVAL  1954  . PARATHYROIDECTOMY  2001 & 2006  . TONSILLECTOMY  1936    Family History  Problem Relation Age of Onset  . Stroke Mother 10  . Kidney disease Father 84  . Breast cancer Sister   . Esophageal cancer Brother     Allergies  Allergen Reactions  . Sulfa Antibiotics Rash    Current Outpatient Medications on File Prior to Visit  Medication Sig Dispense Refill  . acetaminophen (TYLENOL) 325 MG tablet Take 650 mg by mouth as needed (arthritis).    . AMITIZA 8 MCG capsule TAKE 1 CAPSULE TWICE A DAY WITH MEALS 180 capsule 1  . aspirin 81 MG tablet Take 81 mg by mouth daily.    Marland Kitchen atenolol (TENORMIN) 50 MG tablet TAKE 1 TABLET DAILY 90 tablet 1  . calcium-vitamin D (OSCAL WITH D) 500-200 MG-UNIT tablet Take 1 tablet by mouth.    . clotrimazole-betamethasone (LOTRISONE) cream Apply 1 application topically 2 (two) times daily. 30  g 0  . dicyclomine (BENTYL) 10 MG capsule TAKE 1 CAPSULE 30 MINUTES BEFORE MEALS FOR IRRITABLE BOWEL SYNDROME/CRAMPING 90 capsule 3  . docusate sodium (COLACE) 100 MG capsule Take 100 mg by mouth 2 (two) times daily.    Marland Kitchen esomeprazole (NEXIUM) 40 MG capsule TAKE 1 CAPSULE DAILY 90 capsule 1  . hydrochlorothiazide (HYDRODIURIL) 25 MG tablet TAKE 1 TABLET DAILY 90 tablet 1  . KLOR-CON M20 20 MEQ tablet TAKE 1 TABLET DAILY 90 tablet 1  . polyethylene glycol (MIRALAX / GLYCOLAX) packet Take 17 g by mouth daily.    . simvastatin (ZOCOR) 40 MG tablet TAKE 1 TABLET DAILY 90 tablet 1   No current facility-administered medications on file prior to visit.     BP (!) 150/74 (BP Location: Right Arm, Cuff Size: Large)   Pulse 80   Temp 98.2 F (36.8 C) (Oral)   Resp 18   Ht 5\' 6"  (1.676 m)   Wt 198 lb 6.4 oz (90 kg)   SpO2 98%   BMI 32.02 kg/m       Objective:   Physical Exam  Constitutional: She is oriented to person, place, and time. She appears well-developed and well-nourished.  Cardiovascular: Normal rate, regular rhythm and normal heart sounds.  No murmur heard. Pulmonary/Chest: Effort normal and breath sounds normal. No respiratory distress. She has no wheezes.  Musculoskeletal:  2-3+ bilateral LE edema  Neurological: She is alert and oriented to person, place, and time.  Skin: Skin is warm and dry.  Psychiatric: She has a normal mood and affect. Her behavior is normal. Judgment and thought content normal.          Assessment & Plan:  HTN- BP acceptable continue current meds.  BP Readings from Last 3 Encounters:  09/27/17 (!) 150/74  09/12/17 118/72  07/18/17 128/76   GERD- stable on PPI, continue same.  Hyperlipidemia- tolerating statin, obtain follow up lipid panel.   Vision problem- refer to ophthalmology for eye exam.

## 2017-09-27 NOTE — Telephone Encounter (Signed)
Med list has been updated 

## 2017-10-12 ENCOUNTER — Other Ambulatory Visit: Payer: Self-pay | Admitting: Physician Assistant

## 2017-10-12 ENCOUNTER — Other Ambulatory Visit: Payer: Self-pay | Admitting: Family

## 2017-10-13 ENCOUNTER — Other Ambulatory Visit: Payer: Self-pay | Admitting: Family

## 2017-10-17 ENCOUNTER — Telehealth: Payer: Self-pay

## 2017-10-17 NOTE — Telephone Encounter (Signed)
Received PA form from [employer]. Form completed and faxed to [phone]. Awaiting determination.

## 2017-10-23 NOTE — Telephone Encounter (Signed)
Have not received determination. Form re-faxed to [employer].

## 2017-10-25 NOTE — Telephone Encounter (Signed)
Form faxed a third time- again waiting determination.

## 2017-10-26 ENCOUNTER — Encounter: Payer: Self-pay | Admitting: Family

## 2017-10-26 DIAGNOSIS — Z961 Presence of intraocular lens: Secondary | ICD-10-CM | POA: Diagnosis not present

## 2017-10-26 DIAGNOSIS — H26492 Other secondary cataract, left eye: Secondary | ICD-10-CM | POA: Diagnosis not present

## 2017-10-26 LAB — HM DIABETES EYE EXAM

## 2017-10-26 NOTE — Telephone Encounter (Signed)
Please see letter.

## 2017-10-26 NOTE — Telephone Encounter (Signed)
Received denial from insurance because original PA form box was checked stating pt was going to use Linzess, Trulance, symporic, Relistor or Movantik in conjunction with Amitiza. This box was checked in error. We thought it was asking if she had tried one of these alternatives.  Can you do a letter to appeal this decision? Denial has been placed in your yellow folder for review.

## 2017-10-26 NOTE — Telephone Encounter (Signed)
Appeal letter faxed to [phone].

## 2017-10-30 NOTE — Telephone Encounter (Signed)
Received new PA request form for Amitiza. Form completed and faxed to [phone]. Awaiting response.

## 2017-11-07 NOTE — Telephone Encounter (Signed)
Called [phone] and spoke with Merleen Nicely. She states PA appeal request was received but they need to have pt sign the consent form in the papers that were previously faxed to [place] and for [place] to fax them back to [phone]. Notified pt. She will try to come in by Friday to sign the consent which has been placed at the front desk. Once pt signs consent, please fax consent only to below # at [facility].

## 2017-11-08 NOTE — Telephone Encounter (Signed)
Consent form signed by patient and faxed to below number.

## 2017-11-18 ENCOUNTER — Other Ambulatory Visit: Payer: Self-pay | Admitting: Family

## 2017-11-21 NOTE — Telephone Encounter (Signed)
Called below # to check on status of Amitiza PA and was told that request is still in appeal process and to call back at the end of the week to check status. "Appeals may take up to 90 days but they try to get them completed within 30 days".

## 2017-11-28 NOTE — Telephone Encounter (Signed)
Author phoned [phone] to get status of amitiza PA. Lamount Cohen. Stated that the case was open 7/29, and they are "almost done. Should take about another week". Janett Billow stated that they will send confirmation to patient and our office once complete.

## 2017-12-04 NOTE — Telephone Encounter (Signed)
Received PA approval from [place] for Amitiza. Attempted to notify pt and was told that she was not available at this time but should return in about 1 hour. Advised spouse I would call pt back later. Spoke with Arbie Cookey at [employer] and requested that they re-process Amitiza Rx from 10/13/17.

## 2017-12-04 NOTE — Telephone Encounter (Signed)
Notified pt and she voices understanding. 

## 2017-12-09 ENCOUNTER — Other Ambulatory Visit: Payer: Self-pay | Admitting: Family

## 2017-12-26 ENCOUNTER — Telehealth: Payer: Self-pay | Admitting: Family

## 2017-12-26 NOTE — Telephone Encounter (Signed)
Prolia benefits received PA not required $185 Ded-met 20% Prolia co-insurance covered by secondary   Patient may owe approximately $0 OOP  Patient due after 12/24/17  Letter mailed to inform patient of benefits and to schedule

## 2017-12-28 ENCOUNTER — Telehealth: Payer: Self-pay | Admitting: Family

## 2017-12-28 DIAGNOSIS — H538 Other visual disturbances: Secondary | ICD-10-CM | POA: Diagnosis not present

## 2017-12-28 DIAGNOSIS — Z79899 Other long term (current) drug therapy: Secondary | ICD-10-CM | POA: Diagnosis not present

## 2017-12-28 DIAGNOSIS — I10 Essential (primary) hypertension: Secondary | ICD-10-CM | POA: Diagnosis not present

## 2017-12-28 DIAGNOSIS — R5381 Other malaise: Secondary | ICD-10-CM | POA: Diagnosis not present

## 2017-12-28 DIAGNOSIS — R202 Paresthesia of skin: Secondary | ICD-10-CM | POA: Diagnosis not present

## 2017-12-28 DIAGNOSIS — E7800 Pure hypercholesterolemia, unspecified: Secondary | ICD-10-CM | POA: Diagnosis not present

## 2017-12-28 DIAGNOSIS — Z7982 Long term (current) use of aspirin: Secondary | ICD-10-CM | POA: Diagnosis not present

## 2017-12-28 DIAGNOSIS — K219 Gastro-esophageal reflux disease without esophagitis: Secondary | ICD-10-CM | POA: Diagnosis not present

## 2017-12-28 DIAGNOSIS — N3000 Acute cystitis without hematuria: Secondary | ICD-10-CM | POA: Diagnosis not present

## 2017-12-28 DIAGNOSIS — R531 Weakness: Secondary | ICD-10-CM | POA: Diagnosis not present

## 2017-12-28 DIAGNOSIS — Z882 Allergy status to sulfonamides status: Secondary | ICD-10-CM | POA: Diagnosis not present

## 2017-12-28 DIAGNOSIS — R5383 Other fatigue: Secondary | ICD-10-CM | POA: Diagnosis not present

## 2017-12-28 DIAGNOSIS — M810 Age-related osteoporosis without current pathological fracture: Secondary | ICD-10-CM | POA: Diagnosis not present

## 2017-12-28 NOTE — Telephone Encounter (Signed)
Noted  

## 2017-12-28 NOTE — Telephone Encounter (Signed)
Copied from [place] [phone] [phone]. Topic: Inquiry >> Dec 28, 2017 10:18 AM Vernona Rieger wrote: Reason for CRM: Juliann Pulse with [facility] called and wanted to let Lenna Sciara know that they sent her to [facility]. She was complaining of blurred vision and tingling. This is just a FYI. If she needs to make a follow up appointment with [facility], she will call back.

## 2017-12-29 ENCOUNTER — Telehealth: Payer: Self-pay | Admitting: Family

## 2017-12-29 NOTE — Telephone Encounter (Signed)
Copied from [place] [id number]. Topic: Appointment Scheduling - Scheduling Inquiry for Clinic >> Dec 29, 2017  9:51 AM Kelsey Butler wrote: Reason for CRM:   Pt scheduled a ED follow up on 09/18. Pt wants to see if she can have her Prolia injection done the same day at the same time.

## 2017-12-29 NOTE — Telephone Encounter (Signed)
Pt called back, will get at [facility] on 01/03/18.

## 2018-01-01 NOTE — Telephone Encounter (Addendum)
Yes, medication is in refrigerator for pt and she has been notified. Pt needs to r/s appt on 9/18. appt r/s for 01/10/18 at 10:40am.

## 2018-01-03 ENCOUNTER — Inpatient Hospital Stay: Payer: Medicare Other | Admitting: Family

## 2018-01-10 ENCOUNTER — Ambulatory Visit (INDEPENDENT_AMBULATORY_CARE_PROVIDER_SITE_OTHER): Payer: Medicare Other | Admitting: Family

## 2018-01-10 ENCOUNTER — Encounter: Payer: Self-pay | Admitting: Family

## 2018-01-10 VITALS — BP 144/75 | HR 71 | Temp 97.8°F | Resp 16 | Ht 66.0 in | Wt 198.4 lb

## 2018-01-10 DIAGNOSIS — R531 Weakness: Secondary | ICD-10-CM | POA: Diagnosis not present

## 2018-01-10 DIAGNOSIS — M810 Age-related osteoporosis without current pathological fracture: Secondary | ICD-10-CM | POA: Diagnosis not present

## 2018-01-10 DIAGNOSIS — R202 Paresthesia of skin: Secondary | ICD-10-CM

## 2018-01-10 DIAGNOSIS — Z23 Encounter for immunization: Secondary | ICD-10-CM | POA: Diagnosis not present

## 2018-01-10 LAB — B12 AND FOLATE PANEL
Folate: 13.3 ng/mL (ref >5.9)
Vitamin B-12: 148 pg/mL — ABNORMAL LOW (ref 211–911)

## 2018-01-10 MED ORDER — DENOSUMAB 60 MG/ML ~~LOC~~ SOSY
60.0000 mg | PREFILLED_SYRINGE | Freq: Once | SUBCUTANEOUS | Status: AC
  Administered 2018-01-10: 60 mg via SUBCUTANEOUS

## 2018-01-10 NOTE — Progress Notes (Signed)
Subjective:    Patient ID: Kelsey Butler, female    DOB: [birth year], 84 y.o.   MRN: [id number]  HPI  Patient is an 84 yr old female who presents today for ER follow up from 9/12.  Hospital discharge summary is reviewed in care everywhere.  She presented with generalized weakness, tingling in the hands and feet, and and blurred vision.  Urinalysis was suggestive of a urinary tract infection.  She was treated empirically with oral Keflex.   She denies further blurred vision. Denies tingling but notes that her feel are always "kind of numb."  This is at her reported baseline.   Denies previous or current urinary symptoms.  Completed keflex.   Osteoporosis- due for prolia shot today.   Review of Systems Past Medical History:  Diagnosis Date  . Arthritis   . Diverticulosis   . GERD (gastroesophageal reflux disease)   . High cholesterol   . History of colon polyps   . Hypertension   . IBS (irritable bowel syndrome)   . Osteoporosis      Social History   Socioeconomic History  . Marital status: Married    Spouse name: Not on file  . Number of children: Not on file  . Years of education: Not on file  . Highest education level: Not on file  Occupational History  . Not on file  Social Needs  . Financial resource strain: Not on file  . Food insecurity:    Worry: Not on file    Inability: Not on file  . Transportation needs:    Medical: Not on file    Non-medical: Not on file  Tobacco Use  . Smoking status: Never Smoker  . Smokeless tobacco: Never Used  Substance and Sexual Activity  . Alcohol use: No  . Drug use: No  . Sexual activity: Not on file  Lifestyle  . Physical activity:    Days per week: Not on file    Minutes per session: Not on file  . Stress: Not on file  Relationships  . Social connections:    Talks on phone: Not on file    Gets together: Not on file    Attends religious service: Not on file    Active member of club or organization: Not on file   Attends meetings of clubs or organizations: Not on file    Relationship status: Not on file  . Intimate partner violence:    Fear of current or ex partner: Not on file    Emotionally abused: Not on file    Physically abused: Not on file    Forced sexual activity: Not on file  Other Topics Concern  . 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    Grew up in [place]   Enjoys travel by train, reading   Completed 3 yrs of college    Past Surgical History:  Procedure Laterality Date  . ABDOMINAL HYSTERECTOMY  1955  . APPENDECTOMY  1942  . CATARACT EXTRACTION, BILATERAL    . ESOPHAGOGASTRODUODENOSCOPY  01/16/14   pt reported  . FEMUR FRACTURE SURGERY     right  . NASAL  SINUS SURGERY  1990   "had sinuses scraped"  . OVARIAN CYST REMOVAL  1954  . PARATHYROIDECTOMY  2001 & 2006  . TONSILLECTOMY  1936    Family History  Problem Relation Age of Onset  . Stroke Mother 37  . Kidney disease Father 64  . Breast cancer Sister   . Esophageal cancer Brother     Allergies  Allergen Reactions  . Sulfa Antibiotics Rash    Current Outpatient Medications on File Prior to Visit  Medication Sig Dispense Refill  . acetaminophen (TYLENOL) 325 MG tablet Take 650 mg by mouth as needed (arthritis).    . AMITIZA 8 MCG capsule TAKE 1 CAPSULE TWICE A DAY WITH MEALS 180 capsule 1  . aspirin 81 MG tablet Take 81 mg by mouth daily.    Marland Kitchen atenolol (TENORMIN) 50 MG tablet TAKE 1 TABLET DAILY 90 tablet 1  . calcium-vitamin D (OSCAL WITH D) 500-200 MG-UNIT tablet Take 1 tablet by mouth.    . dicyclomine (BENTYL) 10 MG capsule TAKE 1 CAPSULE 30 MINUTES BEFORE MEALS FOR IRRITABLE  BOWEL SYNDROME/CRAMPING 90 capsule 3  . docusate sodium (COLACE) 100 MG capsule Take 100 mg by mouth 2 (two) times daily.    Marland Kitchen esomeprazole (NEXIUM) 40 MG capsule TAKE 1 CAPSULE DAILY 90 capsule 1  . hydrochlorothiazide (HYDRODIURIL) 25 MG tablet TAKE 1 TABLET DAILY 90 tablet 1  . potassium chloride SA (K-DUR,KLOR-CON) 20 MEQ tablet TAKE 1 TABLET DAILY 90 tablet 1  . simvastatin (ZOCOR) 40 MG tablet TAKE 1 TABLET DAILY 90 tablet 1   No current facility-administered medications on file prior to visit.     BP (!) 144/75 (BP Location: Right Arm, Patient Position: Sitting, Cuff Size: Large)   Pulse 71   Temp 97.8 F (36.6 C) (Oral)   Resp 16   Ht 5\' 6"  (1.676 m)   Wt 198 lb 6.4 oz (90 kg)   SpO2 97%   BMI 32.02 kg/m       Objective:   Physical Exam  Constitutional: She is oriented to person, place, and time. She appears well-developed and well-nourished.  Cardiovascular: Normal rate, regular rhythm and normal heart sounds.  No murmur heard. Pulses:      Dorsalis pedis pulses are 2+ on the right side, and 2+ on the left side.       Posterior tibial pulses are 2+ on the right side, and 2+ on the left side.  1-2+ bilateral LE edema  Pulmonary/Chest: Effort normal and breath sounds normal. No respiratory distress. She has no wheezes.  Neurological: She is alert and oriented to person, place, and time.  Bilateral LE sensation intact to monofilamen  Psychiatric: She has a normal mood and affect. Her behavior is normal. Judgment and thought content normal.          Assessment & Plan:  Paresthesias- check b12/folate.  Sensation intact.   Generalized weakness- resolved.  Monitor.   Osteoporosis- prolia shot given today. Continue oscal.   Flu shot today.

## 2018-01-10 NOTE — Patient Instructions (Signed)
Please complete lab work prior to leaving.   

## 2018-01-11 ENCOUNTER — Other Ambulatory Visit: Payer: Self-pay | Admitting: Family

## 2018-01-11 ENCOUNTER — Encounter: Payer: Self-pay | Admitting: Family

## 2018-01-11 DIAGNOSIS — E538 Deficiency of other specified B group vitamins: Secondary | ICD-10-CM | POA: Insufficient documentation

## 2018-01-11 HISTORY — DX: Deficiency of other specified B group vitamins: E53.8

## 2018-01-11 NOTE — Telephone Encounter (Signed)
b12 is low. I would like her to start b12 injections 1073mcg IM weekly x 4 weeks, then monthly.

## 2018-01-16 MED ORDER — CYANOCOBALAMIN 1000 MCG/ML IJ SOLN
INTRAMUSCULAR | 0 refills | Status: DC
Start: 2018-01-16 — End: 2018-07-25

## 2018-01-16 NOTE — Telephone Encounter (Signed)
Patient advised of results and advised to start weekly b12 injections x4 weeks and then monthly. She will talked to the person that transports her and will call to arrange first injection for next week.

## 2018-01-16 NOTE — Telephone Encounter (Signed)
Noted  

## 2018-01-31 ENCOUNTER — Encounter: Payer: Self-pay | Admitting: Family

## 2018-01-31 ENCOUNTER — Ambulatory Visit (INDEPENDENT_AMBULATORY_CARE_PROVIDER_SITE_OTHER): Payer: Medicare Other | Admitting: Family

## 2018-01-31 VITALS — BP 127/60 | HR 74 | Temp 97.7°F | Resp 16 | Ht 66.0 in | Wt 198.0 lb

## 2018-01-31 DIAGNOSIS — E539 Vitamin B deficiency, unspecified: Secondary | ICD-10-CM | POA: Diagnosis not present

## 2018-01-31 DIAGNOSIS — E538 Deficiency of other specified B group vitamins: Secondary | ICD-10-CM

## 2018-01-31 DIAGNOSIS — B078 Other viral warts: Secondary | ICD-10-CM | POA: Diagnosis not present

## 2018-01-31 MED ORDER — CYANOCOBALAMIN 1000 MCG/ML IJ SOLN
1000.0000 ug | Freq: Once | INTRAMUSCULAR | Status: AC
  Administered 2018-01-31: 1000 ug via INTRAMUSCULAR

## 2018-01-31 NOTE — Progress Notes (Signed)
Subjective:    Patient ID: Kelsey Butler, female    DOB: 10/02/1927, 84 y.o.   MRN: [id number]  HPI   Patient is an 84 year old female who presents today for follow-up.  Last visit we obtained a B12 level due to complaint of paresthesias.  B12 was noted to be low.  She presents today to initiate B12 injections. She reports + fatigue and wonders if her low b12 level might be contributing.   She also wishes to discuss a knot on her right hand which she reports has been present  For about 1 month.   Review of Systems    see HPI  Past Medical History:  Diagnosis Date  . Arthritis   . B12 deficiency 01/11/2018  . Diverticulosis   . GERD (gastroesophageal reflux disease)   . High cholesterol   . History of colon polyps   . Hypertension   . IBS (irritable bowel syndrome)   . Osteoporosis      Social History   Socioeconomic History  . Marital status: Married    Spouse name: Not on file  . Number of children: Not on file  . Years of education: Not on file  . Highest education level: Not on file  Occupational History  . Not on file  Social Needs  . Financial resource strain: Not on file  . Food insecurity:    Worry: Not on file    Inability: Not on file  . Transportation needs:    Medical: Not on file    Non-medical: Not on file  Tobacco Use  . Smoking status: Never Smoker  . Smokeless tobacco: Never Used  Substance and Sexual Activity  . Alcohol use: No  . Drug use: No  . Sexual activity: Not on file  Lifestyle  . Physical activity:    Days per week: Not on file    Minutes per session: Not on file  . Stress: Not on file  Relationships  . Social connections:    Talks on phone: Not on file    Gets together: Not on file    Attends religious service: Not on file    Active member of club or organization: Not on file    Attends meetings of clubs or organizations: Not on file    Relationship status: Not on file  . Intimate partner violence:    Fear of current or ex  partner: Not on file    Emotionally abused: Not on file    Physically abused: Not on file    Forced sexual activity: Not on file  Other Topics Concern  . 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    Grew up in [place]   Enjoys travel by train, reading   Completed 3 yrs of college    Past Surgical History:  Procedure Laterality Date  . ABDOMINAL HYSTERECTOMY  1955  . APPENDECTOMY  1942  . CATARACT EXTRACTION, BILATERAL    . ESOPHAGOGASTRODUODENOSCOPY  01/16/14   pt reported  . FEMUR FRACTURE SURGERY     right  . NASAL SINUS SURGERY  1990   "had sinuses scraped"  . OVARIAN CYST  REMOVAL  1954  . PARATHYROIDECTOMY  2001 & 2006  . TONSILLECTOMY  1936    Family History  Problem Relation Age of Onset  . Stroke Mother 34  . Kidney disease Father 81  . Breast cancer Sister   . Esophageal cancer Brother     Allergies  Allergen Reactions  . Sulfa Antibiotics Rash    Current Outpatient Medications on File Prior to Visit  Medication Sig Dispense Refill  . acetaminophen (TYLENOL) 325 MG tablet Take 650 mg by mouth as needed (arthritis).    . AMITIZA 8 MCG capsule TAKE 1 CAPSULE TWICE A DAY WITH MEALS 180 capsule 1  . aspirin 81 MG tablet Take 81 mg by mouth daily.    Marland Kitchen atenolol (TENORMIN) 50 MG tablet TAKE 1 TABLET DAILY 90 tablet 1  . calcium-vitamin D (OSCAL WITH D) 500-200 MG-UNIT tablet Take 1 tablet by mouth.    . cyanocobalamin (,VITAMIN B-12,) 1000 MCG/ML injection Inject 1076mcg IM weekly for 4 weeks then once monthly. 1 mL 0  . dicyclomine (BENTYL) 10 MG capsule TAKE 1 CAPSULE 30 MINUTES BEFORE MEALS FOR IRRITABLE BOWEL SYNDROME/CRAMPING 90  capsule 3  . docusate sodium (COLACE) 100 MG capsule Take 100 mg by mouth 2 (two) times daily.    Marland Kitchen esomeprazole (NEXIUM) 40 MG capsule TAKE 1 CAPSULE DAILY 90 capsule 1  . hydrochlorothiazide (HYDRODIURIL) 25 MG tablet TAKE 1 TABLET DAILY 90 tablet 1  . potassium chloride SA (K-DUR,KLOR-CON) 20 MEQ tablet TAKE 1 TABLET DAILY 90 tablet 1  . simvastatin (ZOCOR) 40 MG tablet TAKE 1 TABLET DAILY 90 tablet 1   No current facility-administered medications on file prior to visit.     BP 127/60 (BP Location: Left Arm, Patient Position: Sitting, Cuff Size: Large)   Pulse 74   Temp 97.7 F (36.5 C) (Oral)   Resp 16   Ht 5\' 6"  (1.676 m)   Wt 198 lb (89.8 kg)   SpO2 97%   BMI 31.96 kg/m    Objective:   Physical Exam  Constitutional: She is oriented to person, place, and time. She appears well-developed and well-nourished.  Cardiovascular: Normal rate, regular rhythm and normal heart sounds.  No murmur heard. Pulmonary/Chest: Effort normal and breath sounds normal. No respiratory distress. She has no wheezes.  Neurological: She is alert and oriented to person, place, and time.  Skin: Skin is warm and dry.  Wart noted on right index finger laterally  Psychiatric: She has a normal mood and affect. Her behavior is normal. Judgment and thought content normal.          Assessment & Plan:  Wart- after verbal consent wart was frozen with liquid nitrogen. Pt tolerated. Advised pt to follow up in 1 month for retreatment if not resolved.   b12 deficiency- first shot given today. Plan weekly x 4 weeks then monthly. Hopefully this will also help with her fatigue.

## 2018-02-07 ENCOUNTER — Ambulatory Visit: Payer: Medicare Other | Admitting: Family

## 2018-02-08 ENCOUNTER — Ambulatory Visit (INDEPENDENT_AMBULATORY_CARE_PROVIDER_SITE_OTHER): Payer: Medicare Other

## 2018-02-08 DIAGNOSIS — E539 Vitamin B deficiency, unspecified: Secondary | ICD-10-CM

## 2018-02-08 MED ORDER — CYANOCOBALAMIN 1000 MCG/ML IJ SOLN
1000.0000 ug | Freq: Once | INTRAMUSCULAR | Status: AC
  Administered 2018-02-08: 1000 ug via INTRAMUSCULAR

## 2018-02-08 NOTE — Progress Notes (Signed)
Pre visit review using our clinic tool,if applicable. No additional management support is needed unless otherwise documented below in the visit note.   Pt here for weekly B12 injection per Debbrah Alar NP  B12 1071mcg given IM left deltoid, and pt tolerated injection well.  Next B12 injection scheduled for 1 week.

## 2018-02-14 ENCOUNTER — Ambulatory Visit (INDEPENDENT_AMBULATORY_CARE_PROVIDER_SITE_OTHER): Payer: Medicare Other

## 2018-02-14 DIAGNOSIS — E539 Vitamin B deficiency, unspecified: Secondary | ICD-10-CM

## 2018-02-14 MED ORDER — CYANOCOBALAMIN 1000 MCG/ML IJ SOLN
1000.0000 ug | Freq: Once | INTRAMUSCULAR | Status: AC
  Administered 2018-02-14: 1000 ug via INTRAMUSCULAR

## 2018-02-14 NOTE — Progress Notes (Signed)
Pre visit review using our clinic tool,if applicable. No additional management support is needed unless otherwise documented below in the visit note.   Pt here for monthly B12 injection per order from [facility].   B12 1055mcg given IM right deltoid., and pt tolerated injection well.  Next B12 injection scheduled for 1 month.

## 2018-02-16 ENCOUNTER — Other Ambulatory Visit: Payer: Self-pay | Admitting: Family

## 2018-02-17 ENCOUNTER — Other Ambulatory Visit: Payer: Self-pay | Admitting: Family

## 2018-02-21 ENCOUNTER — Ambulatory Visit (INDEPENDENT_AMBULATORY_CARE_PROVIDER_SITE_OTHER): Payer: Medicare Other

## 2018-02-21 DIAGNOSIS — E538 Deficiency of other specified B group vitamins: Secondary | ICD-10-CM

## 2018-02-21 MED ORDER — CYANOCOBALAMIN 1000 MCG/ML IJ SOLN
1000.0000 ug | Freq: Once | INTRAMUSCULAR | Status: AC
  Administered 2018-02-21: 1000 ug via INTRAMUSCULAR

## 2018-02-21 NOTE — Progress Notes (Addendum)
Pre visit review using our clinic tool,if applicable. No additional management support is needed unless otherwise documented below in the visit note.  .Pt here for monthly B12 injection per order from Elvera Maria, NP  B12 1064mcg given IM left deltoid, and pt tolerated injection well.  Next B12 injection scheduled for 1 month. Patient states she will see provider next mont and will get B12 injection at that time.   LPN note reviewed and agree.

## 2018-04-03 ENCOUNTER — Encounter: Payer: Self-pay | Admitting: Family

## 2018-04-03 ENCOUNTER — Telehealth: Payer: Self-pay | Admitting: Gastroenterology

## 2018-04-03 ENCOUNTER — Other Ambulatory Visit (HOSPITAL_BASED_OUTPATIENT_CLINIC_OR_DEPARTMENT_OTHER): Payer: Medicare Other

## 2018-04-03 ENCOUNTER — Ambulatory Visit (INDEPENDENT_AMBULATORY_CARE_PROVIDER_SITE_OTHER): Payer: Medicare Other | Admitting: Family

## 2018-04-03 VITALS — BP 128/78 | HR 69 | Temp 98.1°F | Resp 16 | Ht 66.0 in | Wt 196.0 lb

## 2018-04-03 DIAGNOSIS — R109 Unspecified abdominal pain: Secondary | ICD-10-CM

## 2018-04-03 DIAGNOSIS — R195 Other fecal abnormalities: Secondary | ICD-10-CM | POA: Diagnosis not present

## 2018-04-03 DIAGNOSIS — R55 Syncope and collapse: Secondary | ICD-10-CM

## 2018-04-03 DIAGNOSIS — E538 Deficiency of other specified B group vitamins: Secondary | ICD-10-CM | POA: Diagnosis not present

## 2018-04-03 LAB — CBC WITH DIFFERENTIAL/PLATELET
Basophils Absolute: 0 10*3/uL (ref 0.0–0.1)
Basophils Relative: 0.6 % (ref 0.0–3.0)
Eosinophils Absolute: 0.1 10*3/uL (ref 0.0–0.7)
Eosinophils Relative: 1.5 % (ref 0.0–5.0)
HCT: 40.5 % (ref 36.0–46.0)
Hemoglobin: 13.8 g/dL (ref 12.0–15.0)
Lymphocytes Relative: 22.2 % (ref 12.0–46.0)
Lymphs Abs: 1.6 10*3/uL (ref 0.7–4.0)
MCHC: 34.2 g/dL (ref 30.0–36.0)
MCV: 89.5 fl (ref 78.0–100.0)
Monocytes Absolute: 0.6 10*3/uL (ref 0.1–1.0)
Monocytes Relative: 7.9 % (ref 3.0–12.0)
Neutro Abs: 4.9 10*3/uL (ref 1.4–7.7)
Neutrophils Relative %: 67.8 % (ref 43.0–77.0)
Platelets: 319 10*3/uL (ref 150.0–400.0)
RBC: 4.52 Mil/uL (ref 3.87–5.11)
RDW: 13.6 % (ref 11.5–15.5)
WBC: 7.3 10*3/uL (ref 4.0–10.5)

## 2018-04-03 LAB — COMPREHENSIVE METABOLIC PANEL
ALT: 11 U/L (ref 0–35)
AST: 14 U/L (ref 0–37)
Albumin: 4.3 g/dL (ref 3.5–5.2)
Alkaline Phosphatase: 63 U/L (ref 39–117)
BUN: 14 mg/dL (ref 6–23)
CO2: 27 mEq/L (ref 19–32)
Calcium: 9.2 mg/dL (ref 8.4–10.5)
Chloride: 97 mEq/L (ref 96–112)
Creatinine, Ser: 0.77 mg/dL (ref 0.40–1.20)
GFR: 75.13 mL/min (ref >60.00)
Glucose, Bld: 103 mg/dL — ABNORMAL HIGH (ref 70–99)
Potassium: 3.7 mEq/L (ref 3.5–5.1)
Sodium: 133 mEq/L — ABNORMAL LOW (ref 135–145)
Total Bilirubin: 0.6 mg/dL (ref 0.2–1.2)
Total Protein: 6.8 g/dL (ref 6.0–8.3)

## 2018-04-03 LAB — LIPASE: Lipase: 20 U/L (ref 11.0–59.0)

## 2018-04-03 MED ORDER — CYANOCOBALAMIN 1000 MCG/ML IJ SOLN
1000.0000 ug | Freq: Once | INTRAMUSCULAR | Status: AC
  Administered 2018-04-03: 1000 ug via INTRAMUSCULAR

## 2018-04-03 NOTE — Telephone Encounter (Signed)
If that is the patient's preference, and if Dr. Lyndel Safe agreeable, then of course I support that choice.

## 2018-04-03 NOTE — Progress Notes (Addendum)
Subjective:    Patient ID: Kelsey Butler, female    DOB: April 03, 1928, 84 y.o.   MRN: [id number]  HPI  Patient is an 84 year old female with hx of IBS, colon polyps and diverticulosis, and hiatal hernia who presents today with chief complaint of abdominal pain.  She reports that her abdominal pain has been present for several weeks.  Patient reports that she had a syncopal episode while on a bus trip in [place] on 12/6.  She was riding in the bus on the way home. Got blurred vision an increased abdominal pain/ressure.  Reports that she was with her husband and lost consciousness "for an instant."  Put her head down did not vomit.  She reports that EMS was contacted and she declined to go to the hospital.  Reports + abdominal pressure, then vision gets blurry and then she drinks gasex and the medicine.   Almost passed out 2 days ago.  Notes that she had a bm 4 times this AM, and a lot of gas.  Denies any blood in the stool but notes that the stool is darker than usual.   She notes increased stress recently.  Denies Cp or SOB.    Review of Systems See HPI  Past Medical History:  Diagnosis Date  . Arthritis   . B12 deficiency 01/11/2018  . Diverticulosis   . GERD (gastroesophageal reflux disease)   . High cholesterol   . History of colon polyps   . Hypertension   . IBS (irritable bowel syndrome)   . Osteoporosis      Social History   Socioeconomic History  . Marital status: Married    Spouse name: Not on file  . Number of children: Not on file  . Years of education: Not on file  . Highest education level: Not on file  Occupational History  . Not on file  Social Needs  . Financial resource strain: Not on file  . Food insecurity:    Worry: Not on file    Inability: Not on file  . Transportation needs:    Medical: Not on file    Non-medical: Not on file  Tobacco Use  . Smoking status: Never Smoker  . Smokeless tobacco: Never Used  Substance and Sexual Activity  . Alcohol use:  No  . Drug use: No  . Sexual activity: Not on file  Lifestyle  . Physical activity:    Days per week: Not on file    Minutes per session: Not on file  . Stress: Not on file  Relationships  . Social connections:    Talks on phone: Not on file    Gets together: Not on file    Attends religious service: Not on file    Active member of club or organization: Not on file    Attends meetings of clubs or organizations: Not on file    Relationship status: Not on file  . Intimate partner violence:    Fear of current or ex partner: Not on file    Emotionally abused: Not on file    Physically abused: Not on file    Forced sexual activity: Not on file  Other Topics Concern  . 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    Grew up in [place]   Enjoys travel by train, reading   Completed 3 yrs of college    Past Surgical History:  Procedure Laterality Date  . ABDOMINAL HYSTERECTOMY  1955  . APPENDECTOMY  1942  . CATARACT EXTRACTION, BILATERAL    . ESOPHAGOGASTRODUODENOSCOPY  01/16/14   pt reported  . FEMUR FRACTURE SURGERY     right  . NASAL SINUS SURGERY  1990   "had sinuses scraped"  . OVARIAN CYST REMOVAL  1954  . PARATHYROIDECTOMY  2001 & 2006  . TONSILLECTOMY  1936    Family History  Problem Relation Age of Onset  . Stroke Mother 51  . Kidney disease Father 80  . Breast cancer Sister   . Esophageal cancer Brother     Allergies  Allergen Reactions  . Sulfa Antibiotics Rash    Current Outpatient Medications on File Prior to Visit  Medication Sig Dispense Refill  . acetaminophen (TYLENOL) 325 MG tablet Take 650 mg by mouth as needed (arthritis).     . AMITIZA 8 MCG capsule TAKE 1 CAPSULE TWICE A DAY WITH MEALS 180 capsule 1  . aspirin 81 MG tablet Take 81 mg by mouth daily.    Marland Kitchen atenolol (TENORMIN) 50 MG tablet TAKE 1 TABLET DAILY 90 tablet 1  . calcium-vitamin D (OSCAL WITH D) 500-200 MG-UNIT tablet Take 1 tablet by mouth.    . cyanocobalamin (,VITAMIN B-12,) 1000 MCG/ML injection Inject 1024mcg IM weekly for 4 weeks then once monthly. 1 mL 0  . dicyclomine (BENTYL) 10 MG capsule TAKE 1 CAPSULE 30 MINUTES BEFORE MEALS FOR IRRITABLE BOWEL SYNDROME/CRAMPING 90 capsule 3  . docusate sodium (COLACE) 100 MG capsule Take 100 mg by mouth 2 (two) times daily.    Marland Kitchen esomeprazole (NEXIUM) 40 MG capsule TAKE 1 CAPSULE DAILY 90 capsule 1  . hydrochlorothiazide (HYDRODIURIL) 25 MG tablet TAKE 1 TABLET DAILY 90 tablet 1  . polyethylene glycol powder (GLYCOLAX/MIRALAX) powder Take 1 Container by mouth once.    . potassium chloride SA (K-DUR,KLOR-CON) 20 MEQ tablet TAKE 1 TABLET DAILY 90 tablet 1  . simvastatin (ZOCOR) 40 MG tablet TAKE 1 TABLET DAILY 90 tablet 1   No current facility-administered medications on file prior to visit.     BP 128/78 (BP Location: Right Arm, Patient Position: Sitting, Cuff Size: Small)   Pulse 69   Temp 98.1 F (36.7 C) (Oral)   Resp 16   Ht 5\' 6"  (1.676 m)   Wt 196 lb (88.9 kg)   SpO2 97%   BMI 31.64 kg/m       Objective:   Physical Exam Constitutional:      Appearance: She is well-developed.  Neck:     Musculoskeletal: Neck supple.     Thyroid: No thyromegaly.  Cardiovascular:     Rate and Rhythm: Normal rate and regular rhythm.     Heart sounds: Normal heart sounds. No murmur.  Pulmonary:     Effort: Pulmonary effort is normal. No respiratory distress.     Breath sounds: Normal breath sounds. No wheezing.  Abdominal:     General: Bowel sounds are increased.     Palpations: Abdomen is soft.     Tenderness: There is generalized abdominal tenderness. There is no guarding or rebound.  Skin:     General: Skin is warm and dry.  Neurological:     Mental Status:  She is alert and oriented to person, place, and time.  Psychiatric:        Behavior: Behavior normal.        Thought Content: Thought content normal.        Judgment: Judgment normal.   rectal:  + external hemorrhoid, normal rectal exam with heme +stool noted.       Assessment & Plan:  Abdominal pain- concerning for diverticulitis/colitis.  Heme positive stool. Will obtain CT abd/pelvis with contrast as well as cmet, lipase, cbc and refer to gastroenterology. She is advised to go to the ER if new/worsening symptoms, weakness or recurrent syncope.   Syncope- suspect that this was a vasovagal reaction to her pain. EKG tracing is personally reviewed.  EKG notes NSR.  No acute changes.    b12 deficiency- injection today.   Addendum: Patient reports that she does not have transportation which will allow her to come back later this afternoon.  Unfortunately we cannot do the CT scan now because she needs to drink the contrast and we do not have an up-to-date creatinine level.  I suggested that she go to the ED now instead which she declines.  She prefers to wait until tomorrow morning when her husband can come with her.  We discussed risks of delaying CT especially if she has something such as a perforated colon or infection.  She states that she understands this risk and wishes to accepted and it is her preference to return tomorrow.

## 2018-04-03 NOTE — Telephone Encounter (Signed)
Dr.Gupta please see note below. Will you accept to see this patient?

## 2018-04-03 NOTE — Patient Instructions (Addendum)
Please go to the ER if you develop worsening abdominal pain or recurrent episode of passing out. Complete lab work prior to leaving. Stop by the imaging department to pick up your oral contrast.  Return at 4;30 for CT of your abdomen. We will contact you with your results.

## 2018-04-04 ENCOUNTER — Ambulatory Visit (HOSPITAL_BASED_OUTPATIENT_CLINIC_OR_DEPARTMENT_OTHER)
Admission: RE | Admit: 2018-04-04 | Discharge: 2018-04-04 | Disposition: A | Discharge status: Home / Self Care | Payer: Medicare Other | Source: Ambulatory Visit | Attending: Family | Admitting: Family

## 2018-04-04 ENCOUNTER — Ambulatory Visit: Payer: Medicare Other | Admitting: Family

## 2018-04-04 ENCOUNTER — Telehealth: Payer: Self-pay | Admitting: Family

## 2018-04-04 ENCOUNTER — Encounter (HOSPITAL_BASED_OUTPATIENT_CLINIC_OR_DEPARTMENT_OTHER): Payer: Self-pay

## 2018-04-04 ENCOUNTER — Encounter: Payer: Self-pay | Admitting: Gastroenterology

## 2018-04-04 DIAGNOSIS — R109 Unspecified abdominal pain: Secondary | ICD-10-CM

## 2018-04-04 MED ORDER — IOPAMIDOL (ISOVUE-300) INJECTION 61%
100.0000 mL | Freq: Once | INTRAVENOUS | Status: AC | PRN
  Administered 2018-04-04: 100 mL via INTRAVENOUS

## 2018-04-04 NOTE — Telephone Encounter (Signed)
Please let pt know that her CT looks good. No concerning findings. I am still concerned about the blood in her stool and would like her to meet with GI for consult. They should contact her for appointment.  I suspect her stomach is hurting due to a virus.  Let me know if symptoms worsen or if symptoms are not improved in 2-3 days.

## 2018-04-04 NOTE — Telephone Encounter (Signed)
Advised patient of results, she has appointment with Dr. Lyndel Safe on 04-12-18. Advised to call back if symptoms get worst or do not improve.

## 2018-04-04 NOTE — Telephone Encounter (Signed)
Sure. No problems

## 2018-04-04 NOTE — Telephone Encounter (Signed)
Patient has been scheduled to see Dr.Gupta 04/12/18 at 9:45am.

## 2018-04-11 ENCOUNTER — Other Ambulatory Visit: Payer: Self-pay | Admitting: Family

## 2018-04-12 ENCOUNTER — Encounter: Payer: Self-pay | Admitting: Gastroenterology

## 2018-04-12 ENCOUNTER — Ambulatory Visit (INDEPENDENT_AMBULATORY_CARE_PROVIDER_SITE_OTHER): Payer: Medicare Other | Admitting: Gastroenterology

## 2018-04-12 VITALS — BP 134/72 | HR 77 | Ht 66.5 in | Wt 197.4 lb

## 2018-04-12 DIAGNOSIS — K581 Irritable bowel syndrome with constipation: Secondary | ICD-10-CM | POA: Diagnosis not present

## 2018-04-12 DIAGNOSIS — K219 Gastro-esophageal reflux disease without esophagitis: Secondary | ICD-10-CM

## 2018-04-12 MED ORDER — LUBIPROSTONE 8 MCG PO CAPS: 8.0000 ug | ORAL_CAPSULE | Freq: Two times a day (BID) | ORAL | 0 refills | Status: DC

## 2018-04-12 MED ORDER — LUBIPROSTONE 8 MCG PO CAPS: 8.0000 ug | ORAL_CAPSULE | Freq: Two times a day (BID) | ORAL | 3 refills | Status: DC

## 2018-04-12 NOTE — Patient Instructions (Addendum)
If you are age 82 or older, your body mass index should be between 23-30. Your Body mass index is 31.38 kg/m. If this is out of the aforementioned range listed, please consider follow up with your Primary Care Provider.  If you are age 14 or younger, your body mass index should be between 19-25. Your Body mass index is 31.38 kg/m. If this is out of the aformentioned range listed, please consider follow up with your Primary Care Provider.   We have sent the following medications to your pharmacy for you to pick up at your convenience: Amitizia 8 mcg twice daily with meals.  Please purchase the following medications over the counter and take as directed: Miralax once daily.  Colace twice daily.   Change Dicyclomine to as needed.  If still with constipation you can start taking Magnesium 200 mg once daily.  If constipation starts getting better you can reduce Amitiza to once daily.  Maintain a schedule.  Thank you,  Dr. Jackquline Denmark

## 2018-04-12 NOTE — Progress Notes (Signed)
Chief Complaint: FU  Referring Provider:  Debbrah Alar, NP      ASSESSMENT AND PLAN;   #1. IBS with predom constipation, chronic generalized abdominal pain and abdominal bloating. Neg CT 03/2018, Neg EGD and colon 02/2014 except for gastric polyps.  Negative celiac screen and normal TSH.  History of lactose intolerance. #2. GERD #3. B12 def (Dx 12/2017) #4. Heme positive stool but Nl CBC 03/2018  Plan: - Miralax  17g po qd. - Colace 1 tab po bid on a regular basis. - Amitza 8 mcg po bid with meals.  Once she gets better, we will cut down on amities up. - Change dicyclomine to PRN. - If still with problems, will add magnesium 200 mg p.o. once a day. - FU in 4 weeks.  At follow-up, consider cutting down on Nexium to every other day. - Reassured the patient.  Encouraged her to maintain a schedule.  I have reviewed the CT scan of the abdomen pelvis with the patient in detail.   HPI:    Kelsey Butler is a 84 y.o. female  Has been doing somewhat better lately. Has generalized abdominal discomfort with abdominal bloating and intermittent constipation-better now. No nausea, vomiting, significant heartburn regurgitation odynophagia or dysphagia. No weight loss. She does sleep well. Does admit that her symptoms are worse when she is under stress. Diagnosed with B12 deficiency and is currently on B12 supplementation with monthly shots. Had heme positive stools but refuses colonoscopy. I have reviewed the previous EGD and colonoscopy with the patient in detail. No melena or hematochezia.  Past Medical History:  Diagnosis Date  . Arthritis   . B12 deficiency 01/11/2018  . Colon polyp    1990  . Diverticulosis   . Diverticulosis   . GERD (gastroesophageal reflux disease)   . High cholesterol   . History of colon polyps   . Hypertension   . IBS (irritable bowel syndrome)   . Osteoporosis   . Pneumonia     Past Surgical History:  Procedure Laterality Date  . ABDOMINAL  HYSTERECTOMY  1955  . APPENDECTOMY  1942  . CATARACT EXTRACTION, BILATERAL    . COLONOSCOPY WITH ESOPHAGOGASTRODUODENOSCOPY (EGD)     [facility]   . ESOPHAGOGASTRODUODENOSCOPY  01/16/14   pt reported  . FEMUR FRACTURE SURGERY Right 2015  . NASAL SINUS SURGERY  1990   "had sinuses scraped"  . OVARIAN CYST REMOVAL  1954  . PARATHYROIDECTOMY  2001 & 2006  . TONSILLECTOMY  1936    Family History  Problem Relation Age of Onset  . Stroke Mother 75  . Kidney disease Father 39  . Breast cancer Sister   . Esophageal cancer Brother   . Colon cancer Neg Hx     Social History   Tobacco Use  . Smoking status: Former Research scientist (life sciences)  . Smokeless tobacco: Never Used  . Tobacco comment: quit over 30 Years ago  Substance Use Topics  . Alcohol use: No  . Drug use: No    Current Outpatient Medications  Medication Sig Dispense Refill  . acetaminophen (TYLENOL) 325 MG tablet Take 650 mg by mouth as needed (arthritis).    . AMITIZA 8 MCG capsule TAKE 1 CAPSULE TWICE A DAY WITH MEALS 180 capsule 1  . aspirin 81 MG tablet Take 81 mg by mouth daily.    Marland Kitchen atenolol (TENORMIN) 50 MG tablet TAKE 1 TABLET DAILY 90 tablet 1  . calcium-vitamin D (OSCAL WITH D) 500-200 MG-UNIT tablet Take 1  tablet by mouth.    . cyanocobalamin (,VITAMIN B-12,) 1000 MCG/ML injection Inject 1083mcg IM weekly for 4 weeks then once monthly. 1 mL 0  . dicyclomine (BENTYL) 10 MG capsule TAKE 1 CAPSULE 30 MINUTES BEFORE MEALS FOR IRRITABLE BOWEL SYNDROME/CRAMPING 90 capsule 3  . docusate sodium (COLACE) 100 MG capsule Take 100 mg by mouth 2 (two) times daily.    Marland Kitchen esomeprazole (NEXIUM) 40 MG capsule TAKE 1 CAPSULE DAILY 90 capsule 1  . hydrochlorothiazide (HYDRODIURIL) 25 MG tablet TAKE 1 TABLET DAILY 90 tablet 4  . polyethylene glycol powder (GLYCOLAX/MIRALAX) powder Take by mouth daily.     . potassium chloride SA (K-DUR,KLOR-CON) 20 MEQ tablet TAKE 1 TABLET DAILY 90 tablet 1  . simvastatin (ZOCOR) 40 MG tablet TAKE 1  TABLET DAILY 90 tablet 1   No current facility-administered medications for this visit.     Allergies  Allergen Reactions  . Sulfa Antibiotics Rash    Review of Systems:  Negative     Physical Exam:    BP 134/72   Pulse 77   Ht 5' 6.5" (1.689 m)   Wt 197 lb 6 oz (89.5 kg)   BMI 31.38 kg/m  Filed Weights   04/12/18 0948  Weight: 197 lb 6 oz (89.5 kg)   Constitutional:  Well-developed, in no acute distress. Psychiatric: Normal mood and affect. Behavior is normal. HEENT: Pupils normal.  Conjunctivae are normal. No scleral icterus. Neck supple.  Cardiovascular: Normal rate, regular rhythm. No edema Pulmonary/chest: Effort normal and breath sounds normal. No wheezing, rales or rhonchi. Abdominal: Soft, nondistended. Nontender. Bowel sounds active throughout. There are no masses palpable. No hepatomegaly. Rectal:  defered Neurological: Alert and oriented to person place and time. Skin: Skin is warm and dry. No rashes noted.  Data Reviewed: I have personally reviewed following labs and imaging studies  CBC: CBC Latest Ref Rng & Units 04/03/2018 11/01/2016  WBC 4.0 - 10.5 K/uL 7.3 8.0  Hemoglobin 12.0 - 15.0 g/dL 13.8 13.7  Hematocrit 36.0 - 46.0 % 40.5 40.0  Platelets 150.0 - 400.0 K/uL 319.0 370.0    CMP: CMP Latest Ref Rng & Units 04/03/2018 09/27/2017 06/27/2017  Glucose 70 - 99 mg/dL 103(H) 102(H) 112(H)  BUN 6 - 23 mg/dL 14 14 11   Creatinine 0.40 - 1.20 mg/dL 0.77 0.74 0.69  Sodium 135 - 145 mEq/L 133(L) 134(L) 134(L)  Potassium 3.5 - 5.1 mEq/L 3.7 4.4 4.6  Chloride 96 - 112 mEq/L 97 96 96  CO2 19 - 32 mEq/L 27 28 30   Calcium 8.4 - 10.5 mg/dL 9.2 9.7 9.8  Total Protein 6.0 - 8.3 g/dL 6.8 - -  Total Bilirubin 0.2 - 1.2 mg/dL 0.6 - -  Alkaline Phos 39 - 117 U/L 63 - -  AST 0 - 37 U/L 14 - -  ALT 0 - 35 U/L 11 - -      Radiology Studies: Ct Abdomen Pelvis W Contrast  Result Date: 04/04/2018 CLINICAL DATA:  Abdominal pain with unspecified location EXAM:  CT ABDOMEN AND PELVIS WITH CONTRAST TECHNIQUE: Multidetector CT imaging of the abdomen and pelvis was performed using the standard protocol following bolus administration of intravenous contrast. CONTRAST:  185mL ISOVUE-300 IOPAMIDOL (ISOVUE-300) INJECTION 61% COMPARISON:  Report from abdominal CT 01/09/2017 FINDINGS: Lower chest:  No contributory findings. Hepatobiliary: Few tiny low densities in the right lobe which are too small for densitometry/characterization.No evidence of biliary obstruction or stone. Pancreas: Unremarkable. Spleen: Unremarkable. Adrenals/Urinary Tract: 2.9 cm right adrenal nodule with  macroscopic fat consistent with myelolipoma, size stable from outside imaging report. No hydronephrosis or stone. Tiny presumed right renal cyst. Unremarkable bladder. Stomach/Bowel: No obstruction. No inflammatory changes. Appendectomy. Vascular/Lymphatic: No acute vascular abnormality. Atherosclerosis to a degree commonly seen at this age. No mass or adenopathy. Reproductive:Hysterectomy. Other: No ascites or pneumoperitoneum. Musculoskeletal: No acute abnormalities. Prominent osteopenia and lower lumbar facet spurring IMPRESSION: 1. No acute finding or specific explanation for abdominal pain. 2. History of constipation; no abnormal stool retention today. Electronically Signed   By: Monte Fantasia M.D.   On: 04/04/2018 09:54  Reviewed the CT scan with the patient in detail. 25 minutes spent with the patient today. Greater than 50% was spent in counseling and coordination of care with the patient     Carmell Austria, MD 04/12/2018, 10:17 AM  Cc: Debbrah Alar, NP

## 2018-05-15 ENCOUNTER — Ambulatory Visit (INDEPENDENT_AMBULATORY_CARE_PROVIDER_SITE_OTHER): Payer: Medicare Other | Admitting: Gastroenterology

## 2018-05-15 ENCOUNTER — Encounter: Payer: Self-pay | Admitting: Gastroenterology

## 2018-05-15 ENCOUNTER — Ambulatory Visit (INDEPENDENT_AMBULATORY_CARE_PROVIDER_SITE_OTHER): Payer: Medicare Other | Admitting: *Deleted

## 2018-05-15 VITALS — BP 130/74 | HR 75 | Ht 66.5 in | Wt 196.2 lb

## 2018-05-15 DIAGNOSIS — K581 Irritable bowel syndrome with constipation: Secondary | ICD-10-CM | POA: Diagnosis not present

## 2018-05-15 DIAGNOSIS — E538 Deficiency of other specified B group vitamins: Secondary | ICD-10-CM | POA: Diagnosis not present

## 2018-05-15 MED ORDER — CYANOCOBALAMIN 1000 MCG/ML IJ SOLN
1000.0000 ug | Freq: Once | INTRAMUSCULAR | Status: AC
  Administered 2018-05-15: 1000 ug via INTRAMUSCULAR

## 2018-05-15 MED ORDER — LINACLOTIDE 72 MCG PO CAPS: 72.0000 ug | ORAL_CAPSULE | Freq: Every day | ORAL | 0 refills | Status: DC

## 2018-05-15 NOTE — Progress Notes (Signed)
Patient was here for monthly b12 injection.  B12 injection given.  Patient stated how much longer she will have to do these?  Advised patient to discuss with PCP at next visit on 06/19/18.  She will hold off on making next monthly appt until she spoke with PCP.

## 2018-05-15 NOTE — Patient Instructions (Signed)
If you are age 83 or older, your body mass index should be between 23-30. Your Body mass index is 31.2 kg/m. If this is out of the aforementioned range listed, please consider follow up with your Primary Care Provider.  If you are age 30 or younger, your body mass index should be between 19-25. Your Body mass index is 31.2 kg/m. If this is out of the aformentioned range listed, please consider follow up with your Primary Care Provider.   Please purchase the following medications over the counter and take as directed: Miralax once daily. Colace twice daily Magnesium 200 mg once daily.   We have given you samples of the following medication to take: Linzess 72 mcg take as needed.   Thank you,  Dr. Jackquline Denmark

## 2018-05-15 NOTE — Progress Notes (Signed)
Chief Complaint: FU  Referring Provider:  Debbrah Alar, NP      ASSESSMENT AND PLAN;   #1. IBS with predom constipation, chronic generalized abdominal pain and abdominal bloating. Neg CT 03/2018, Neg EGD and colon 02/2014 except for gastric polyps.  Negative celiac screen and normal TSH.  History of lactose intolerance. Did not tolerate amitiza d/t nausea #2. GERD #3. B12 def (Dx 12/2017) #4. Heme positive stool but Nl CBC 03/2018  Plan: - Miralax  17g po qd. - Colace 1 tab po bid on a regular basis. - Linzess 75 mcg po qd (samples given).  Use if there is no bowel movements for 2 to 3 days.. - Change dicyclomine to PRN. - Add magnesium 200 mg p.o. once a day. - FU in 12 weeks. - Reassured the patient.  Encouraged her to maintain a schedule.  I have reviewed the CT scan of the abdomen pelvis with the patient in detail.   HPI:    Kelsey Butler is a 85 y.o. female  For FU Didn't tolerate amtiza d/t nausea. Has been doing somewhat better lately. Has generalized abdominal discomfort with abdominal bloating and intermittent constipation-better now. No nausea, vomiting, significant heartburn regurgitation odynophagia or dysphagia. No weight loss. She does sleep well. Does admit that her symptoms are worse when she is under stress. Diagnosed with B12 deficiency and is currently on B12 supplementation with monthly shots. Had heme positive stools but refuses colonoscopy. I have reviewed the previous EGD and colonoscopy with the patient in detail. No melena or hematochezia.  Past Medical History:  Diagnosis Date  . Arthritis   . B12 deficiency 01/11/2018  . Colon polyp    1990  . Diverticulosis   . Diverticulosis   . GERD (gastroesophageal reflux disease)   . High cholesterol   . History of colon polyps   . Hypertension   . IBS (irritable bowel syndrome)   . Osteoporosis   . Pneumonia     Past Surgical History:  Procedure Laterality Date  . ABDOMINAL HYSTERECTOMY   1955  . APPENDECTOMY  1942  . CATARACT EXTRACTION, BILATERAL    . COLONOSCOPY WITH ESOPHAGOGASTRODUODENOSCOPY (EGD)     [facility]   . ESOPHAGOGASTRODUODENOSCOPY  01/16/14   pt reported  . FEMUR FRACTURE SURGERY Right 2015  . NASAL SINUS SURGERY  1990   "had sinuses scraped"  . OVARIAN CYST REMOVAL  1954  . PARATHYROIDECTOMY  2001 & 2006  . TONSILLECTOMY  1936    Family History  Problem Relation Age of Onset  . Stroke Mother 37  . Kidney disease Father 55  . Breast cancer Sister   . Esophageal cancer Brother   . Colon cancer Neg Hx     Social History   Tobacco Use  . Smoking status: Former Research scientist (life sciences)  . Smokeless tobacco: Never Used  . Tobacco comment: quit over 30 Years ago  Substance Use Topics  . Alcohol use: No  . Drug use: No    Current Outpatient Medications  Medication Sig Dispense Refill  . acetaminophen (TYLENOL) 325 MG tablet Take 650 mg by mouth as needed (arthritis).    Marland Kitchen aspirin 81 MG tablet Take 81 mg by mouth daily.    Marland Kitchen atenolol (TENORMIN) 50 MG tablet TAKE 1 TABLET DAILY 90 tablet 1  . calcium-vitamin D (OSCAL WITH D) 500-200 MG-UNIT tablet Take 1 tablet by mouth.    . cyanocobalamin (,VITAMIN B-12,) 1000 MCG/ML injection Inject 1096mcg IM weekly for 4 weeks  then once monthly. 1 mL 0  . dicyclomine (BENTYL) 10 MG capsule TAKE 1 CAPSULE 30 MINUTES BEFORE MEALS FOR IRRITABLE BOWEL SYNDROME/CRAMPING 90 capsule 3  . docusate sodium (COLACE) 100 MG capsule Take 100 mg by mouth 2 (two) times daily.    Marland Kitchen esomeprazole (NEXIUM) 40 MG capsule TAKE 1 CAPSULE DAILY 90 capsule 1  . hydrochlorothiazide (HYDRODIURIL) 25 MG tablet TAKE 1 TABLET DAILY 90 tablet 4  . polyethylene glycol powder (GLYCOLAX/MIRALAX) powder Take by mouth daily.     . potassium chloride SA (K-DUR,KLOR-CON) 20 MEQ tablet TAKE 1 TABLET DAILY 90 tablet 1  . simvastatin (ZOCOR) 40 MG tablet TAKE 1 TABLET DAILY 90 tablet 1  . lubiprostone (AMITIZA) 8 MCG capsule Take 1 capsule (8 mcg  total) by mouth 2 (two) times daily with a meal. (Patient not taking: Reported on 05/15/2018) 60 capsule 3   No current facility-administered medications for this visit.     Allergies  Allergen Reactions  . Sulfa Antibiotics Rash    Review of Systems:  Negative     Physical Exam:    BP 130/74   Pulse 75   Ht 5' 6.5" (1.689 m)   Wt 196 lb 4 oz (89 kg)   BMI 31.20 kg/m  Filed Weights   05/15/18 1323  Weight: 196 lb 4 oz (89 kg)   Constitutional:  Well-developed, in no acute distress. Psychiatric: Normal mood and affect. Behavior is normal. HEENT: Pupils normal.  Conjunctivae are normal. No scleral icterus. Neck supple.  Cardiovascular: Normal rate, regular rhythm. No edema Pulmonary/chest: Effort normal and breath sounds normal. No wheezing, rales or rhonchi. Abdominal: Soft, nondistended. Nontender. Bowel sounds active throughout. There are no masses palpable. No hepatomegaly. Rectal:  defered Neurological: Alert and oriented to person place and time. Skin: Skin is warm and dry. No rashes noted.  Data Reviewed: I have personally reviewed following labs and imaging studies  CBC: CBC Latest Ref Rng & Units 04/03/2018 11/01/2016  WBC 4.0 - 10.5 K/uL 7.3 8.0  Hemoglobin 12.0 - 15.0 g/dL 13.8 13.7  Hematocrit 36.0 - 46.0 % 40.5 40.0  Platelets 150.0 - 400.0 K/uL 319.0 370.0    CMP: CMP Latest Ref Rng & Units 04/03/2018 09/27/2017 06/27/2017  Glucose 70 - 99 mg/dL 103(H) 102(H) 112(H)  BUN 6 - 23 mg/dL 14 14 11   Creatinine 0.40 - 1.20 mg/dL 0.77 0.74 0.69  Sodium 135 - 145 mEq/L 133(L) 134(L) 134(L)  Potassium 3.5 - 5.1 mEq/L 3.7 4.4 4.6  Chloride 96 - 112 mEq/L 97 96 96  CO2 19 - 32 mEq/L 27 28 30   Calcium 8.4 - 10.5 mg/dL 9.2 9.7 9.8  Total Protein 6.0 - 8.3 g/dL 6.8 - -  Total Bilirubin 0.2 - 1.2 mg/dL 0.6 - -  Alkaline Phos 39 - 117 U/L 63 - -  AST 0 - 37 U/L 14 - -  ALT 0 - 35 U/L 11 - -   Reviewed the CT scan with the patient in detail. 15 minutes spent  with the patient today. Greater than 50% was spent in counseling and coordination of care with the patient     Carmell Austria, MD 05/15/2018, 1:52 PM  Cc: Debbrah Alar, NP

## 2018-05-18 NOTE — Progress Notes (Signed)
CMA note reviewed. Will need to continue monthly injections. Will discuss at upcoming apt.   Nance Pear NP

## 2018-05-19 ENCOUNTER — Other Ambulatory Visit: Payer: Self-pay | Admitting: Family

## 2018-05-23 DIAGNOSIS — K581 Irritable bowel syndrome with constipation: Secondary | ICD-10-CM

## 2018-05-23 MED ORDER — LINACLOTIDE 72 MCG PO CAPS: 72.0000 ug | ORAL_CAPSULE | Freq: Every day | ORAL | 0 refills | Status: DC

## 2018-05-23 NOTE — Telephone Encounter (Signed)
Medication ordered per MD recommendations; patient will be notified;

## 2018-05-23 NOTE — Telephone Encounter (Signed)
Dr. Lyndel Safe pt- please advise  Please review patient message concerning a RX/samples for Linzess

## 2018-06-01 ENCOUNTER — Telehealth: Payer: Self-pay | Admitting: Gastroenterology

## 2018-06-01 DIAGNOSIS — K581 Irritable bowel syndrome with constipation: Secondary | ICD-10-CM

## 2018-06-01 MED ORDER — LINACLOTIDE 72 MCG PO CAPS: 72.0000 ug | ORAL_CAPSULE | Freq: Every day | ORAL | 0 refills | Status: DC

## 2018-06-01 MED ORDER — LINACLOTIDE 72 MCG PO CAPS: 72.0000 ug | ORAL_CAPSULE | Freq: Every day | ORAL | 6 refills | Status: DC

## 2018-06-01 NOTE — Telephone Encounter (Signed)
Pt called stating that she has not received Linzess from [facility] yet. She would like a 10-day supply sent to [facility] in [place] since she is out of med.

## 2018-06-01 NOTE — Telephone Encounter (Signed)
Sent 10 day supple to [facility].

## 2018-06-05 ENCOUNTER — Other Ambulatory Visit: Payer: Self-pay

## 2018-06-05 DIAGNOSIS — K581 Irritable bowel syndrome with constipation: Secondary | ICD-10-CM

## 2018-06-05 MED ORDER — LINACLOTIDE 72 MCG PO CAPS: 72.0000 ug | ORAL_CAPSULE | Freq: Every day | ORAL | 11 refills | Status: DC

## 2018-06-05 MED ORDER — LINACLOTIDE 72 MCG PO CAPS: 72.0000 ug | ORAL_CAPSULE | Freq: Every day | ORAL | 0 refills | Status: DC

## 2018-06-05 NOTE — Progress Notes (Signed)
I have resent prescription to patients pharmacy °

## 2018-06-09 ENCOUNTER — Other Ambulatory Visit: Payer: Self-pay | Admitting: Family

## 2018-06-12 ENCOUNTER — Other Ambulatory Visit: Payer: Self-pay

## 2018-06-12 DIAGNOSIS — K581 Irritable bowel syndrome with constipation: Secondary | ICD-10-CM

## 2018-06-12 MED ORDER — LINACLOTIDE 72 MCG PO CAPS: 72.0000 ug | ORAL_CAPSULE | Freq: Every day | ORAL | 3 refills | Status: DC

## 2018-06-14 ENCOUNTER — Other Ambulatory Visit: Payer: Self-pay | Admitting: Physician Assistant

## 2018-06-14 ENCOUNTER — Telehealth: Payer: Self-pay | Admitting: Gastroenterology

## 2018-06-14 NOTE — Telephone Encounter (Signed)
Dr. Lyndel Safe saw patient last. Please advise how many refills he wants patient to have and send.

## 2018-06-14 NOTE — Telephone Encounter (Signed)
Pt return call °

## 2018-06-14 NOTE — Telephone Encounter (Signed)
I have called patient back left patient a message to return call.

## 2018-06-19 ENCOUNTER — Ambulatory Visit: Payer: Medicare Other

## 2018-06-20 ENCOUNTER — Telehealth: Payer: Self-pay | Admitting: Gastroenterology

## 2018-06-20 ENCOUNTER — Ambulatory Visit (INDEPENDENT_AMBULATORY_CARE_PROVIDER_SITE_OTHER): Payer: Medicare Other | Admitting: *Deleted

## 2018-06-20 DIAGNOSIS — E538 Deficiency of other specified B group vitamins: Secondary | ICD-10-CM

## 2018-06-20 MED ORDER — CYANOCOBALAMIN 1000 MCG/ML IJ SOLN
1000.0000 ug | Freq: Once | INTRAMUSCULAR | Status: AC
  Administered 2018-06-20: 1000 ug via INTRAMUSCULAR

## 2018-06-20 NOTE — Progress Notes (Signed)
Patient her for monthly b12 injection.  Injection given and patient tolerated well  She will talk with Melissa at next visit on 07/24/18 about how long she will have to take.

## 2018-07-12 ENCOUNTER — Other Ambulatory Visit: Payer: Self-pay | Admitting: Family

## 2018-07-12 IMAGING — DX DG KNEE COMPLETE 4+V*R*
4 series · 4 of 4 positions shown · non-contrast
Comparison: 11/01/2016.

CLINICAL DATA: Right knee pain for 6 months. Follow-up lesion
evaluation from prior study of 11/01/2016.

EXAM:
RIGHT KNEE - COMPLETE 4+ VIEW

[knee ap]
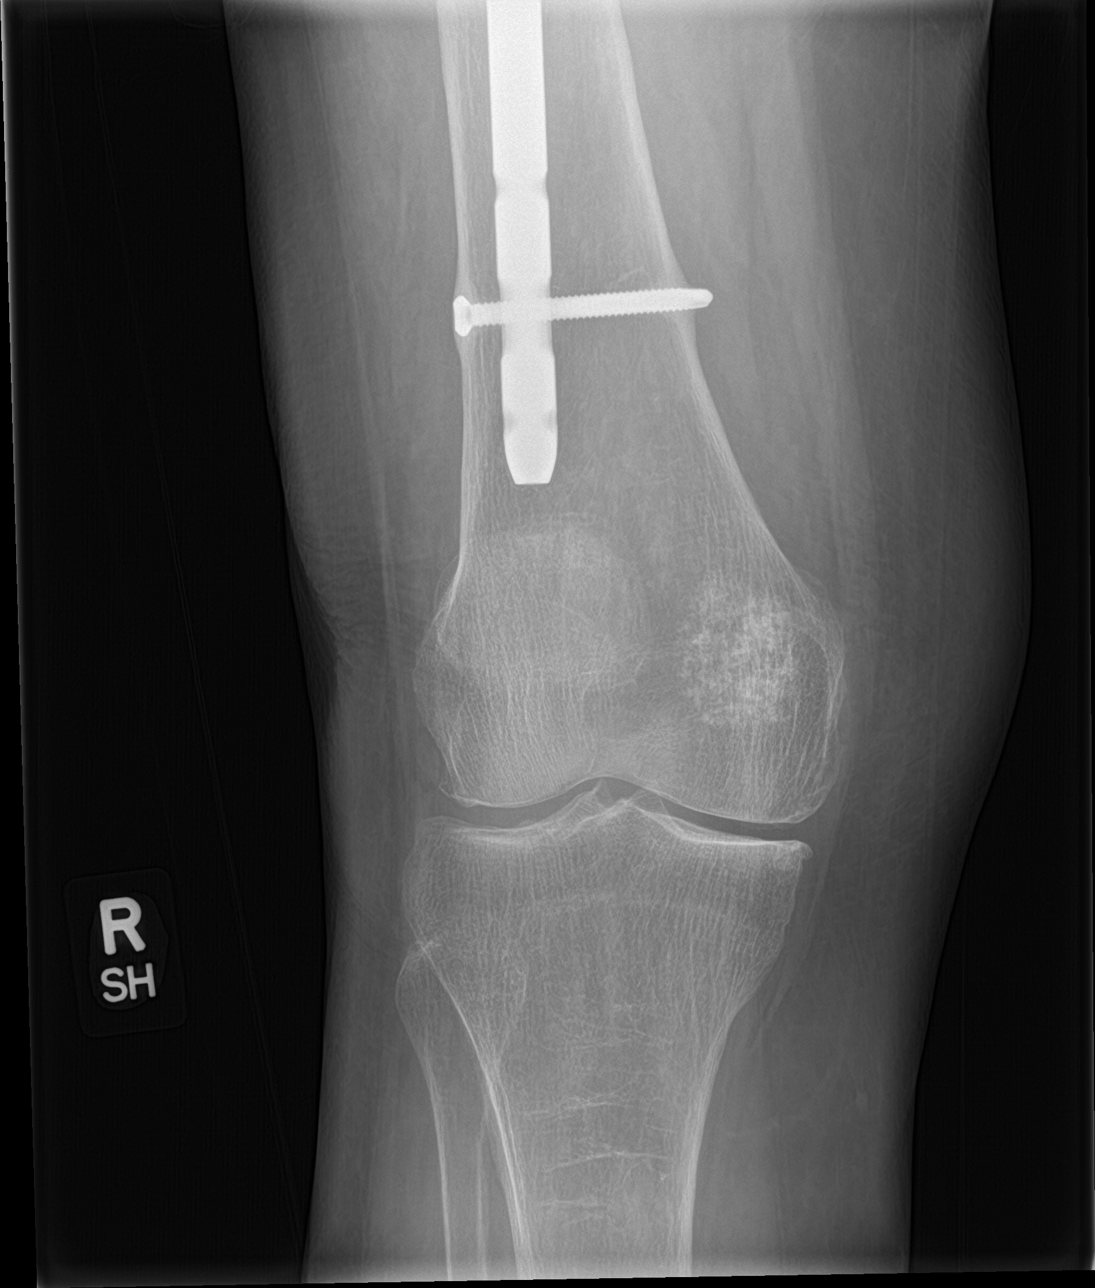

[knee lat]
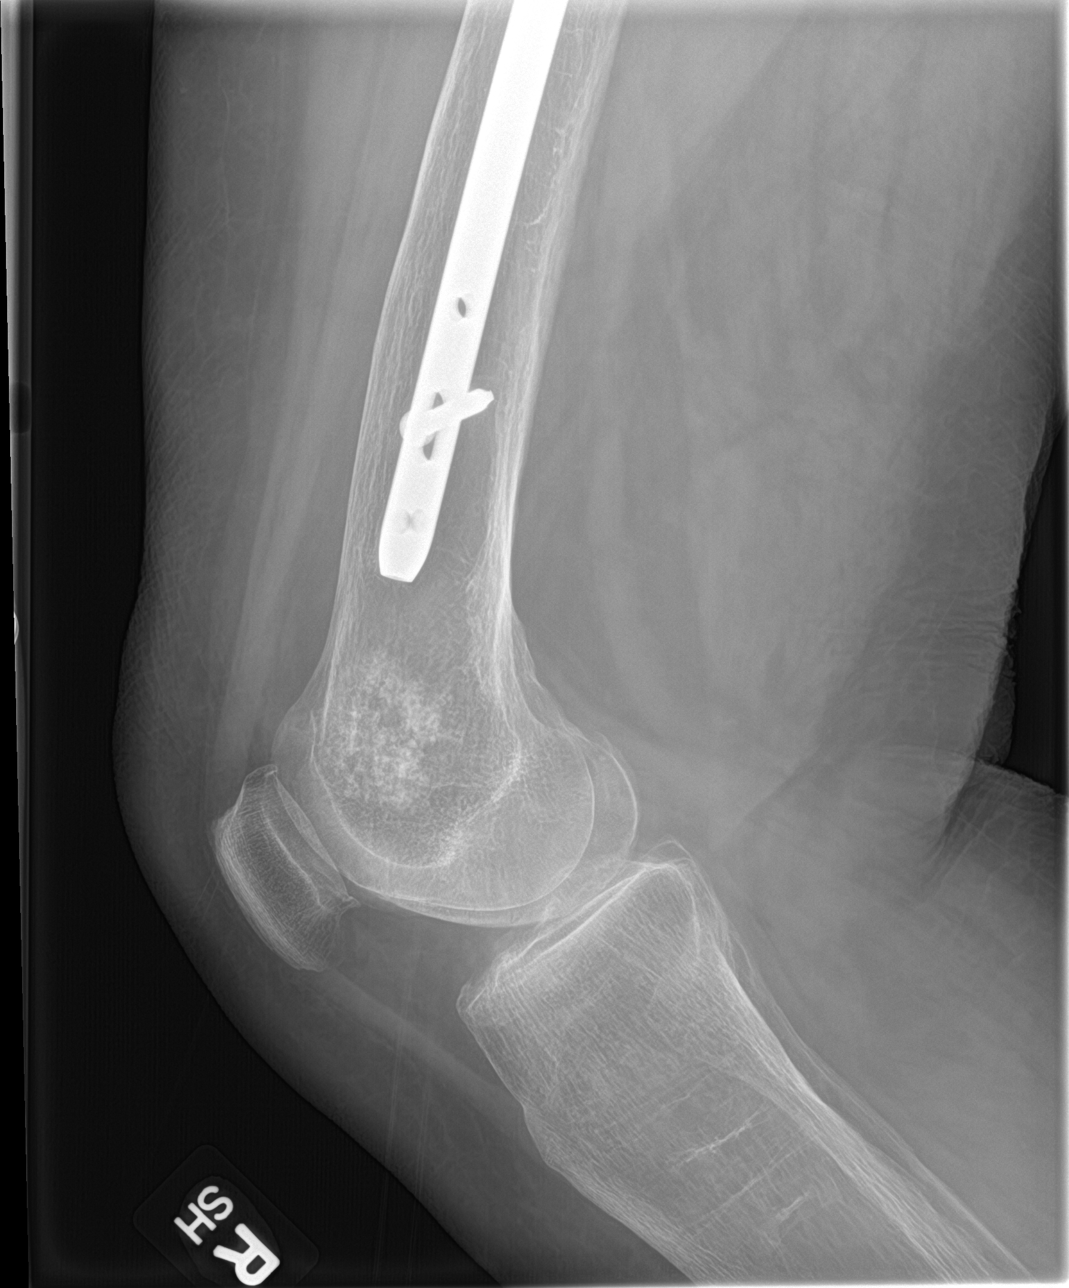

[knee obl (1 of 2)]
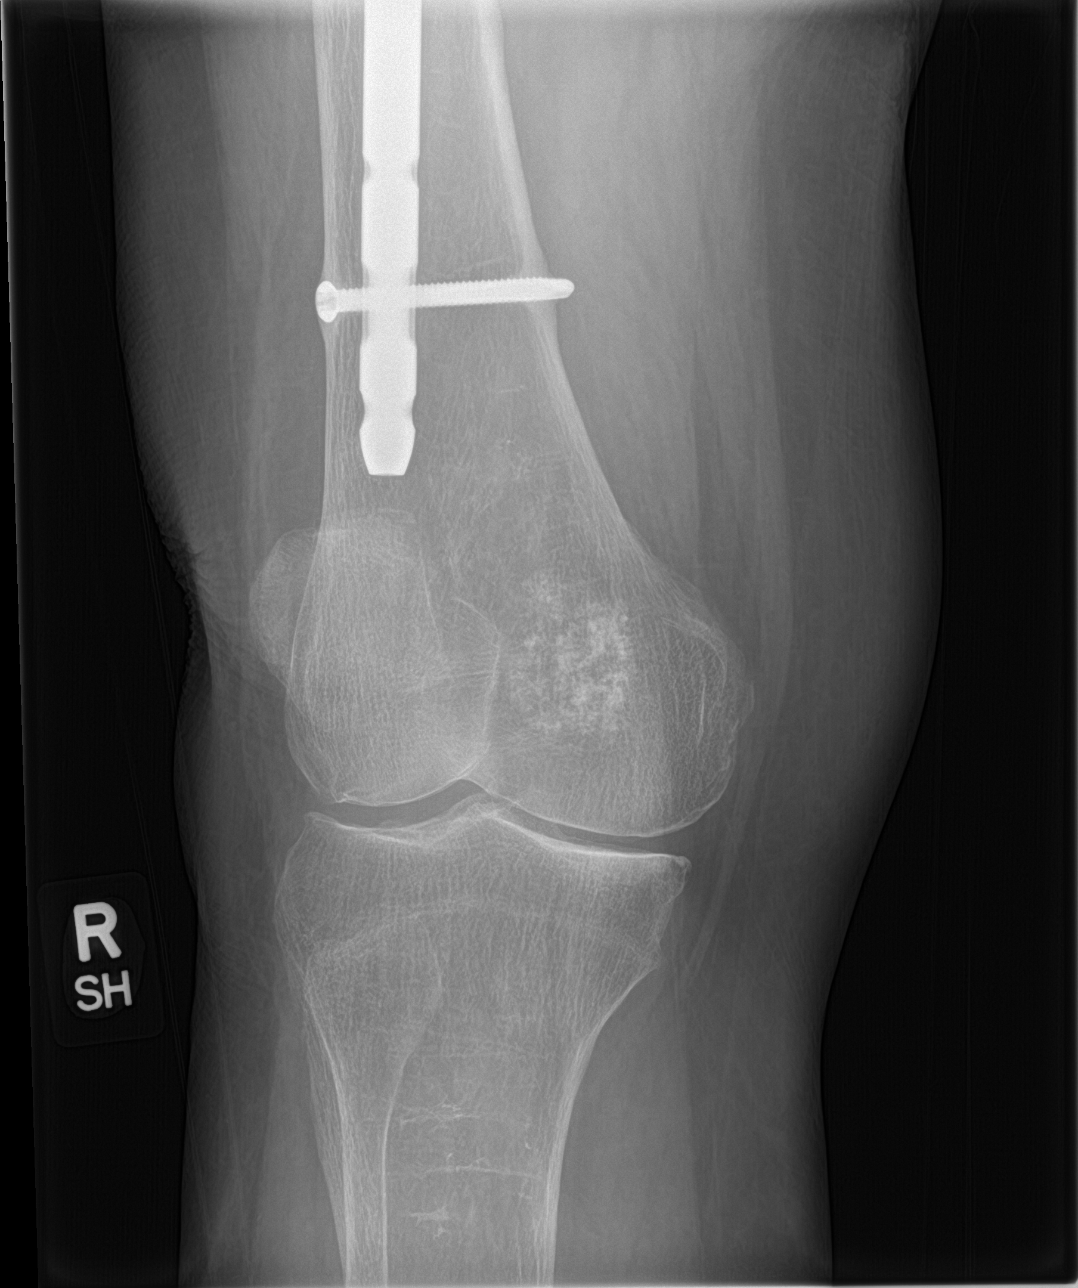

[knee obl (2 of 2)]
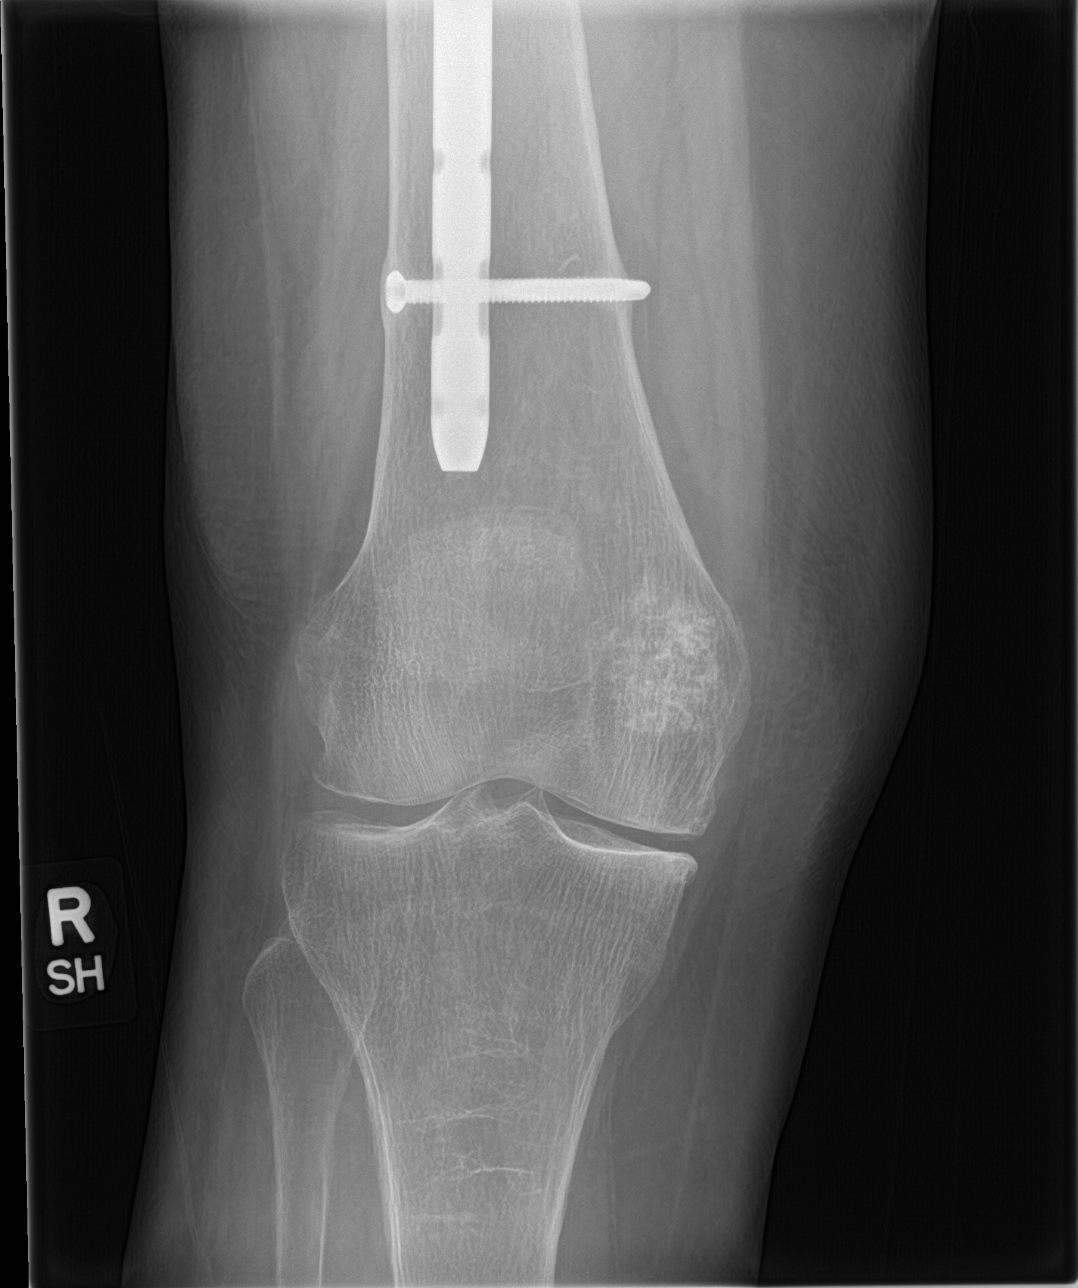

[4 of 4 positions shown; findings below may reference images not displayed]

FINDINGS: Persistent lesion is noted in the medial femoral condyle with
chondroid type calcification. This is most consistent with a
chondroid tumor. Using same measurement technique, no interim change
in size from prior exam. No interim change in appearance. No
cortical breakthrough. Diffuse osteopenia degenerative change. Prior
ORIF right femur.
IMPRESSION: Persistent unchanged lesion in the right medial femoral condyle. The
lesion has chondroid type calcification and is most likely a
chondroid tumor. Again no interim change from prior exam.

## 2018-07-19 ENCOUNTER — Encounter: Payer: Self-pay | Admitting: Family

## 2018-07-19 NOTE — Telephone Encounter (Signed)
Attempted to reach pt by phone but spouse states pt is unavailable at this time and to try back later.

## 2018-07-19 NOTE — Telephone Encounter (Signed)
Attempted to reach pt again but she is out for a walk. Will try again.

## 2018-07-23 NOTE — Telephone Encounter (Signed)
Spoke with pt. She does not have access to a smart phone. 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 Pt is agreeable to do telephone visit on 4/8 @ 11am if PCP is agreeable. Advised pt if PCP would rather r/s the visit we would call her back, otherwise be available for phone visit. Pt voices understanding.

## 2018-07-24 ENCOUNTER — Ambulatory Visit: Payer: Medicare Other | Admitting: Family

## 2018-07-25 ENCOUNTER — Ambulatory Visit (INDEPENDENT_AMBULATORY_CARE_PROVIDER_SITE_OTHER): Payer: Medicare Other | Admitting: Family

## 2018-07-25 ENCOUNTER — Ambulatory Visit: Payer: Medicare Other | Admitting: Gastroenterology

## 2018-07-25 ENCOUNTER — Other Ambulatory Visit: Payer: Self-pay

## 2018-07-25 DIAGNOSIS — E785 Hyperlipidemia, unspecified: Secondary | ICD-10-CM | POA: Diagnosis not present

## 2018-07-25 DIAGNOSIS — I10 Essential (primary) hypertension: Secondary | ICD-10-CM

## 2018-07-25 DIAGNOSIS — K589 Irritable bowel syndrome without diarrhea: Secondary | ICD-10-CM

## 2018-07-25 DIAGNOSIS — K219 Gastro-esophageal reflux disease without esophagitis: Secondary | ICD-10-CM

## 2018-07-25 DIAGNOSIS — E538 Deficiency of other specified B group vitamins: Secondary | ICD-10-CM

## 2018-07-25 MED ORDER — B-12 1000 MCG SL SUBL: SUBLINGUAL_TABLET | SUBLINGUAL | 0 refills | Status: AC

## 2018-07-25 NOTE — Progress Notes (Addendum)
Virtual Visit via Telephone   I connected with Kelsey Butler on 07/25/18 at 11:00 AM EDT by a phone visit and verified that I am speaking with the correct person. We did attempt a virtual visit by video but that was not successful so the visti was transitioned to a phone visit. This visit type was conducted due to national recommendations for restrictions regarding the COVID-19 Pandemic (e.g. social distancing).  This format is felt to be most appropriate for this patient at this time.   I discussed the limitations of evaluation and management by telemedicine. The patient expressed understanding and agreed to proceed.  Only the patient and myself were on today's video visit. The patient was at home and I was in my office at the time of today's visit.   History of Present Illness:  HTN- reports that she is not checking her bp at home reports that she has ankle edema at the end of the day if she doesn't put her feet up.   BP Readings from Last 3 Encounters:  05/15/18 130/74  04/12/18 134/72  04/03/18 128/78   IBS- using linzess which seems to be helping. Reports that she had some sob and felt faint on amitiza.   Hyperlipidemia- continues statin without myalgia.  Lab Results  Component Value Date   CHOL 191 09/27/2017   HDL 53.10 09/27/2017   LDLCALC 101 (H) 09/27/2017   LDLDIRECT 109.0 06/05/2015   TRIG 185.0 (H) 09/27/2017   CHOLHDL 4 09/27/2017   gerd- stable on nexium. Denies gerd symptoms    Observations/Objective:   Gen: Awake, alert, no acute distress Resp: Breathing is even, no obvious dyspnea noted Psych: calm/pleasant demeanor Neuro: Alert and Oriented x 3,  speech is clear.   Virtual Visit via Video Note  I connected with Kelsey Butler on 07/25/18 at 11:00 AM EDT by a video enabled telemedicine application and verified that I am speaking with the correct person using two identifiers. This visit type was conducted due to national recommendations for restrictions regarding  the COVID-19 Pandemic (e.g. social distancing).  This format is felt to be most appropriate for this patient at this time.   I discussed the limitations of evaluation and management by telemedicine and the availability of in person appointments. The patient expressed understanding and agreed to proceed.  Only the patient and myself were on today's phone visit. The patient was at home and I was in my office at the time of today's visit.   Objective:   Gen: Awake, alert, no acute distress Resp: Breathing is even and non-labored Psych: calm/pleasant demeanor Neuro: Alert and Oriented x 3,  speech is clear.   Assessment and Plan:  Hyperlipidemia- stable lipids on statin, continue same.  HTN- tolerating medication. She will check her home bp tonight and will send me her reading via mychart.  GERD- stable on PPI.  IBS- stable/improved on Linzess. She is following with GI for this.  b12 deficiency- to keep her out of the clinic for b12 shots in setting of covid 19 pandemic, I have asked the patient to switch to sublingual b12 1074mcg once daily.   Follow Up Instructions:  Plan follow up in person in 3 months for a face to face Medicare wellness visit.  I discussed the assessment and treatment plan with the patient. The patient was provided an opportunity to ask questions and all were answered. The patient agreed with the plan and demonstrated an understanding of the instructions.   The patient was advised  to call back or seek an in-person evaluation if the symptoms worsen or if the condition fails to improve as anticipated.   20 minutes spent with patient on today's phone visit.   Nance Pear, NP

## 2018-07-26 ENCOUNTER — Encounter: Payer: Self-pay | Admitting: Family

## 2018-09-08 ENCOUNTER — Other Ambulatory Visit: Payer: Self-pay | Admitting: Family

## 2018-10-02 ENCOUNTER — Other Ambulatory Visit: Payer: Self-pay

## 2018-10-02 ENCOUNTER — Ambulatory Visit (INDEPENDENT_AMBULATORY_CARE_PROVIDER_SITE_OTHER): Payer: Medicare Other

## 2018-10-02 DIAGNOSIS — E538 Deficiency of other specified B group vitamins: Secondary | ICD-10-CM

## 2018-10-02 DIAGNOSIS — M810 Age-related osteoporosis without current pathological fracture: Secondary | ICD-10-CM

## 2018-10-02 MED ORDER — CYANOCOBALAMIN 1000 MCG/ML IJ SOLN
1000.0000 ug | Freq: Once | INTRAMUSCULAR | Status: AC
  Administered 2018-10-02: 1000 ug via INTRAMUSCULAR

## 2018-10-02 MED ORDER — DENOSUMAB 60 MG/ML ~~LOC~~ SOSY
60.0000 mg | PREFILLED_SYRINGE | Freq: Once | SUBCUTANEOUS | Status: AC
  Administered 2018-10-02: 60 mg via SUBCUTANEOUS

## 2018-10-02 NOTE — Progress Notes (Signed)
Pre visit review using our clinic review tool, if applicable. No additional management support is needed unless otherwise documented below in the visit note.  Patient is here for prolia injection and b12 injection. 9ml Prolia injected in patients right arm SQ. 11mL B12 given in patients left deltoid IM. Patient tolerated well.

## 2018-10-03 NOTE — Progress Notes (Signed)
Reviewed. ° ° °Larina Lieurance S O'Sullivan NP °

## 2018-11-06 ENCOUNTER — Ambulatory Visit (INDEPENDENT_AMBULATORY_CARE_PROVIDER_SITE_OTHER): Payer: Medicare Other | Admitting: Family

## 2018-11-06 ENCOUNTER — Encounter: Payer: Self-pay | Admitting: Family

## 2018-11-06 ENCOUNTER — Other Ambulatory Visit: Payer: Self-pay

## 2018-11-06 VITALS — BP 151/67 | HR 72 | Temp 97.8°F | Resp 16 | Ht 66.5 in | Wt 195.4 lb

## 2018-11-06 DIAGNOSIS — E538 Deficiency of other specified B group vitamins: Secondary | ICD-10-CM | POA: Diagnosis not present

## 2018-11-06 DIAGNOSIS — E785 Hyperlipidemia, unspecified: Secondary | ICD-10-CM

## 2018-11-06 DIAGNOSIS — I10 Essential (primary) hypertension: Secondary | ICD-10-CM | POA: Diagnosis not present

## 2018-11-06 LAB — LIPID PANEL
Cholesterol: 179 mg/dL (ref 0–200)
HDL: 53.7 mg/dL (ref >39.00)
LDL Cholesterol: 92 mg/dL (ref 0–99)
NonHDL: 125.63
Total CHOL/HDL Ratio: 3
Triglycerides: 170 mg/dL — ABNORMAL HIGH (ref 0.0–149.0)
VLDL: 34 mg/dL (ref 0.0–40.0)

## 2018-11-06 LAB — COMPREHENSIVE METABOLIC PANEL
ALT: 10 U/L (ref 0–35)
AST: 13 U/L (ref 0–37)
Albumin: 4.4 g/dL (ref 3.5–5.2)
Alkaline Phosphatase: 58 U/L (ref 39–117)
BUN: 12 mg/dL (ref 6–23)
CO2: 27 mEq/L (ref 19–32)
Calcium: 9.1 mg/dL (ref 8.4–10.5)
Chloride: 95 mEq/L — ABNORMAL LOW (ref 96–112)
Creatinine, Ser: 0.75 mg/dL (ref 0.40–1.20)
GFR: 72.77 mL/min (ref >60.00)
Glucose, Bld: 95 mg/dL (ref 70–99)
Potassium: 4.5 mEq/L (ref 3.5–5.1)
Sodium: 132 mEq/L — ABNORMAL LOW (ref 135–145)
Total Bilirubin: 0.7 mg/dL (ref 0.2–1.2)
Total Protein: 6.4 g/dL (ref 6.0–8.3)

## 2018-11-06 MED ORDER — CYANOCOBALAMIN 1000 MCG/ML IJ SOLN
1000.0000 ug | Freq: Once | INTRAMUSCULAR | Status: AC
  Administered 2018-11-06: 1000 ug via INTRAMUSCULAR

## 2018-11-06 NOTE — Patient Instructions (Signed)
Please begin b12 1065mcg under tongue once daily beginning in 2 weeks. Complete lab work prior to leaving.

## 2018-11-06 NOTE — Progress Notes (Signed)
Subjective:    Patient ID: Kelsey Butler, female    DOB: October 16, 1927, 85 y.o.   MRN: [id number]  HPI  Patient is an 85 year old female who presents today for routine follow-up.  HTN-blood pressure medications include atenolol 50 mg And hydrochlorothiazide 25 mg. BP Readings from Last 3 Encounters:  11/06/18 (!) 151/67  05/15/18 130/74  04/12/18 134/72   b12 deficiency- shot today.   Hyperlipidemia-she is maintained on simvastatin. Lab Results  Component Value Date   CHOL 179 11/06/2018   HDL 53.70 11/06/2018   LDLCALC 92 11/06/2018   LDLDIRECT 109.0 06/05/2015   TRIG 170.0 (H) 11/06/2018   CHOLHDL 3 11/06/2018     Review of Systems    See HPI  Past Medical History:  Diagnosis Date  . Arthritis   . B12 deficiency 01/11/2018  . Colon polyp    1990  . Diverticulosis   . Diverticulosis   . GERD (gastroesophageal reflux disease)   . High cholesterol   . History of colon polyps   . Hypertension   . IBS (irritable bowel syndrome)   . Osteoporosis   . Pneumonia      Social History   Socioeconomic History  . Marital status: Married    Spouse name: Not on file  . Number of children: 1  . Years of education: Not on file  . Highest education level: Not on file  Occupational History  . Occupation: Retired   Scientific laboratory technician  . Financial resource strain: Not on file  . Food insecurity    Worry: Not on file    Inability: Not on file  . Transportation needs    Medical: Not on file    Non-medical: Not on file  Tobacco Use  . Smoking status: Former Research scientist (life sciences)  . Smokeless tobacco: Never Used  . Tobacco comment: quit over 30 Years ago  Substance and Sexual Activity  . Alcohol use: No  . Drug use: No  . Sexual activity: Not on file  Lifestyle  . Physical activity    Days per week: Not on file    Minutes per session: Not on file  . Stress: Not on file  Relationships  . Social Herbalist on phone: Not on file    Gets together: Not on file    Attends  religious service: Not on file    Active member of club or organization: Not on file    Attends meetings of clubs or organizations: Not on file    Relationship status: Not on file  . Intimate partner violence    Fear of current or ex partner: Not on file    Emotionally abused: Not on file    Physically abused: Not on file    Forced sexual activity: Not on file  Other Topics Concern  . 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    Grew up in [place]   Enjoys travel by train, reading   Completed 3 yrs of college    Past Surgical History:  Procedure Laterality Date  . ABDOMINAL HYSTERECTOMY  1955  . APPENDECTOMY  1942  . CATARACT EXTRACTION, BILATERAL    .  COLONOSCOPY WITH ESOPHAGOGASTRODUODENOSCOPY (EGD)     [facility]   . ESOPHAGOGASTRODUODENOSCOPY  01/16/14   pt reported  . FEMUR FRACTURE SURGERY Right 2015  . NASAL SINUS SURGERY  1990   "had sinuses scraped"  . OVARIAN CYST REMOVAL  1954  . PARATHYROIDECTOMY  2001 & 2006  . TONSILLECTOMY  1936    Family History  Problem Relation Age of Onset  . Stroke Mother 67  . Kidney disease Father 43  . Breast cancer Sister   . Esophageal cancer Brother   . Colon cancer Neg Hx     Allergies  Allergen Reactions  . Amitiza [Lubiprostone]     SOB and felt faint  . Sulfa Antibiotics Rash    Current Outpatient Medications on File Prior to Visit  Medication Sig Dispense Refill  . acetaminophen (TYLENOL) 325 MG tablet Take 650 mg by mouth as needed (arthritis).    Marland Kitchen aspirin 81 MG tablet Take 81 mg by mouth daily.    Marland Kitchen atenolol (TENORMIN) 50 MG tablet TAKE 1 TABLET DAILY 90 tablet 3  .  calcium-vitamin D (OSCAL WITH D) 500-200 MG-UNIT tablet Take 1 tablet by mouth.    . Cyanocobalamin (B-12) 1000 MCG SUBL Place one tablet under tongue once daily 90 each 0  . docusate sodium (COLACE) 100 MG capsule Take 100 mg by mouth 2 (two) times daily.    Marland Kitchen esomeprazole (NEXIUM) 40 MG capsule TAKE 1 CAPSULE DAILY 90 capsule 3  . hydrochlorothiazide (HYDRODIURIL) 25 MG tablet TAKE 1 TABLET DAILY 90 tablet 4  . linaclotide (LINZESS) 72 MCG capsule Take 1 capsule (72 mcg total) by mouth daily before breakfast. 90 capsule 3  . polyethylene glycol powder (GLYCOLAX/MIRALAX) powder Take by mouth daily.     . potassium chloride SA (K-DUR,KLOR-CON) 20 MEQ tablet TAKE 1 TABLET DAILY 90 tablet 1  . simvastatin (ZOCOR) 40 MG tablet TAKE 1 TABLET DAILY 90 tablet 1  . Specialty Vitamins Products (MAGNESIUM, AMINO ACID CHELATE,) 133 MG tablet Take 1 tablet by mouth 2 (two) times daily.    Marland Kitchen dicyclomine (BENTYL) 10 MG capsule TAKE 1 CAPSULE 30 MINUTES BEFORE MEALS FOR IRRITABLE BOWEL SYNDROME/CRAMPING (Patient not taking: Reported on 11/06/2018) 90 capsule 3   No current facility-administered medications on file prior to visit.     BP (!) 151/67   Pulse 72   Temp 97.8 F (36.6 C) (Oral)   Resp 16   Ht 5' 6.5" (1.689 m)   Wt 195 lb 6.4 oz (88.6 kg)   SpO2 98%   BMI 31.07 kg/m    Objective:   Physical Exam Constitutional:      Appearance: She is well-developed.  Neck:     Musculoskeletal: Neck supple.     Thyroid: No thyromegaly.  Cardiovascular:     Rate and Rhythm: Normal rate and regular rhythm.     Heart sounds: Normal heart sounds. No murmur.  Pulmonary:     Effort: Pulmonary effort is normal. No respiratory distress.     Breath sounds: Normal breath sounds. No wheezing.  Musculoskeletal:     Right lower leg: 1+ Edema present.     Left lower leg: 1+ Edema present.  Skin:    General: Skin is warm and dry.  Neurological:     Mental Status: She is alert and oriented to person, place,  and time.  Psychiatric:        Behavior: Behavior normal.        Thought Content: Thought content  normal.        Judgment: Judgment normal.           Assessment & Plan:  Hypertension-her initial blood pressure reading was high, however follow-up blood pressure reading was improved.  We will continue current medications.  Will obtain follow-up complete metabolic panel.  B12 deficiency-B12 injection was given today in the office.  Due to COVID she wishes to decrease her returns to our office.  I have advised her to transition to B12 sublingual 1000 mcg daily until we can safely bring her back in routinely for injections.  Hyperlipidemia-patient is tolerating statin.  Will obtain follow-up lipid panel.

## 2018-11-17 ENCOUNTER — Other Ambulatory Visit: Payer: Self-pay | Admitting: Family

## 2018-12-09 ENCOUNTER — Other Ambulatory Visit: Payer: Self-pay | Admitting: Family

## 2018-12-17 ENCOUNTER — Other Ambulatory Visit: Payer: Self-pay

## 2018-12-18 ENCOUNTER — Encounter: Payer: Self-pay | Admitting: Family

## 2018-12-18 ENCOUNTER — Other Ambulatory Visit (HOSPITAL_COMMUNITY)
Admission: RE | Admit: 2018-12-18 | Discharge: 2018-12-18 | Disposition: A | Discharge status: Home / Self Care | Payer: Medicare Other | Source: Ambulatory Visit | Attending: Family | Admitting: Family

## 2018-12-18 ENCOUNTER — Ambulatory Visit (INDEPENDENT_AMBULATORY_CARE_PROVIDER_SITE_OTHER): Payer: Medicare Other | Admitting: Family

## 2018-12-18 VITALS — BP 150/82 | HR 68 | Temp 96.2°F | Resp 16 | Ht 66.5 in | Wt 195.0 lb

## 2018-12-18 DIAGNOSIS — L989 Disorder of the skin and subcutaneous tissue, unspecified: Secondary | ICD-10-CM

## 2018-12-18 DIAGNOSIS — Z23 Encounter for immunization: Secondary | ICD-10-CM | POA: Diagnosis not present

## 2018-12-18 DIAGNOSIS — E538 Deficiency of other specified B group vitamins: Secondary | ICD-10-CM

## 2018-12-18 MED ORDER — CYANOCOBALAMIN 1000 MCG/ML IJ SOLN
1000.0000 ug | Freq: Once | INTRAMUSCULAR | Status: AC
  Administered 2018-12-18: 1000 ug via INTRAMUSCULAR

## 2018-12-18 NOTE — Progress Notes (Signed)
   Subjective:    Patient ID: Kelsey Butler, female    DOB: 08-01-1927, 85 y.o.   MRN: [id number]  HPI   Patient is an 85 yr old female who presents today with concern of skin lesion.     Review of Systems     Objective:   Physical Exam Constitutional:      Appearance: Normal appearance.  Skin:    General: Skin is warm and dry.     Comments: Raised dry appearing lesion noted on left forearm  Neurological:     Mental Status: She is alert and oriented to person, place, and time.            Assessment & Plan:  Skin Lesion- Procedure including risks/benefits explained to patient.  Questions were answered. After informed consent was obtained and a time out completed, site was cleansed with betadine and then alcohol. 1% Lidocaine with epinephrine was injected under lesion and then shave biopsy was performed. Area was cauterized to obtain hemostasis.  Pt tolerated procedure well.  Specimen sent for pathology review.  Pt instructed to keep the area dry for 24 hours and to contact us if he develops redness, drainage or swelling at the site.  Pt may use tylenol as needed for discomfort today.   B12 deficiency- b12 injection given today in office.

## 2018-12-18 NOTE — Patient Instructions (Signed)
We will contact you with the results of your biopsy. Good luck with your move to [place]!

## 2018-12-20 ENCOUNTER — Telehealth: Payer: Self-pay | Admitting: Family

## 2018-12-20 DIAGNOSIS — D0462 Carcinoma in situ of skin of left upper limb, including shoulder: Secondary | ICD-10-CM

## 2018-12-20 NOTE — Telephone Encounter (Signed)
Reviewed path results of skin lesion with patient- advised her that she should see dermatology for wider excision.  She moves to [place] on 9/25. We will see if we can get her in with somebody local before she moves.

## 2018-12-25 DIAGNOSIS — L814 Other melanin hyperpigmentation: Secondary | ICD-10-CM | POA: Diagnosis not present

## 2018-12-25 DIAGNOSIS — Z85828 Personal history of other malignant neoplasm of skin: Secondary | ICD-10-CM | POA: Diagnosis not present

## 2018-12-25 DIAGNOSIS — D1801 Hemangioma of skin and subcutaneous tissue: Secondary | ICD-10-CM | POA: Diagnosis not present

## 2018-12-25 DIAGNOSIS — L821 Other seborrheic keratosis: Secondary | ICD-10-CM | POA: Diagnosis not present

## 2018-12-25 DIAGNOSIS — L579 Skin changes due to chronic exposure to nonionizing radiation, unspecified: Secondary | ICD-10-CM | POA: Diagnosis not present

## 2019-01-04 ENCOUNTER — Telehealth: Payer: Self-pay | Admitting: Family

## 2019-01-04 NOTE — Telephone Encounter (Signed)
Can you please call the patient and confirm that she has gotten in to see dermatology about her skin cancer?

## 2019-01-07 NOTE — Telephone Encounter (Signed)
Per patient she has been seen and is having surgery tomorrow for removal of affected skin area.

## 2019-01-08 DIAGNOSIS — L905 Scar conditions and fibrosis of skin: Secondary | ICD-10-CM | POA: Diagnosis not present

## 2019-01-08 DIAGNOSIS — R238 Other skin changes: Secondary | ICD-10-CM | POA: Diagnosis not present

## 2019-01-08 DIAGNOSIS — C44629 Squamous cell carcinoma of skin of left upper limb, including shoulder: Secondary | ICD-10-CM | POA: Diagnosis not present

## 2019-01-16 DIAGNOSIS — U071 COVID-19: Secondary | ICD-10-CM | POA: Diagnosis not present

## 2019-01-16 DIAGNOSIS — R05 Cough: Secondary | ICD-10-CM | POA: Diagnosis not present

## 2019-01-16 DIAGNOSIS — R509 Fever, unspecified: Secondary | ICD-10-CM | POA: Diagnosis not present

## 2019-02-21 DIAGNOSIS — Z139 Encounter for screening, unspecified: Secondary | ICD-10-CM | POA: Diagnosis not present

## 2019-02-21 DIAGNOSIS — K581 Irritable bowel syndrome with constipation: Secondary | ICD-10-CM | POA: Diagnosis not present

## 2019-02-21 DIAGNOSIS — Z9889 Other specified postprocedural states: Secondary | ICD-10-CM | POA: Diagnosis not present

## 2019-02-21 DIAGNOSIS — Z859 Personal history of malignant neoplasm, unspecified: Secondary | ICD-10-CM | POA: Diagnosis not present

## 2019-02-21 DIAGNOSIS — E538 Deficiency of other specified B group vitamins: Secondary | ICD-10-CM | POA: Diagnosis not present

## 2019-02-21 DIAGNOSIS — Z789 Other specified health status: Secondary | ICD-10-CM | POA: Diagnosis not present

## 2019-02-21 DIAGNOSIS — Z7689 Persons encountering health services in other specified circumstances: Secondary | ICD-10-CM | POA: Diagnosis not present

## 2019-02-21 DIAGNOSIS — I10 Essential (primary) hypertension: Secondary | ICD-10-CM | POA: Diagnosis not present

## 2019-02-21 DIAGNOSIS — E785 Hyperlipidemia, unspecified: Secondary | ICD-10-CM | POA: Diagnosis not present

## 2019-02-21 DIAGNOSIS — M810 Age-related osteoporosis without current pathological fracture: Secondary | ICD-10-CM | POA: Diagnosis not present

## 2019-04-16 ENCOUNTER — Other Ambulatory Visit: Payer: Self-pay | Admitting: Gastroenterology

## 2019-04-16 DIAGNOSIS — K581 Irritable bowel syndrome with constipation: Secondary | ICD-10-CM

## 2019-05-07 ENCOUNTER — Ambulatory Visit: Payer: Medicare Other | Admitting: Family

## 2019-05-08 ENCOUNTER — Ambulatory Visit: Payer: Medicare Other | Admitting: Family

## 2019-05-21 DIAGNOSIS — L309 Dermatitis, unspecified: Secondary | ICD-10-CM | POA: Diagnosis not present

## 2019-05-21 DIAGNOSIS — Z79899 Other long term (current) drug therapy: Secondary | ICD-10-CM | POA: Diagnosis not present

## 2019-05-21 DIAGNOSIS — K582 Mixed irritable bowel syndrome: Secondary | ICD-10-CM | POA: Diagnosis not present

## 2019-05-21 DIAGNOSIS — R519 Headache, unspecified: Secondary | ICD-10-CM | POA: Diagnosis not present

## 2019-05-21 DIAGNOSIS — M810 Age-related osteoporosis without current pathological fracture: Secondary | ICD-10-CM | POA: Diagnosis not present

## 2019-05-30 ENCOUNTER — Telehealth: Payer: Self-pay

## 2019-05-30 NOTE — Telephone Encounter (Signed)
Per [employer], Linzess 37mcg was approved until 05/30/2019 Case number [id number]

## 2019-07-09 DIAGNOSIS — I10 Essential (primary) hypertension: Secondary | ICD-10-CM | POA: Diagnosis not present

## 2019-07-09 DIAGNOSIS — K582 Mixed irritable bowel syndrome: Secondary | ICD-10-CM | POA: Diagnosis not present

## 2019-07-09 DIAGNOSIS — Z79899 Other long term (current) drug therapy: Secondary | ICD-10-CM | POA: Diagnosis not present

## 2019-07-15 DIAGNOSIS — K582 Mixed irritable bowel syndrome: Secondary | ICD-10-CM | POA: Diagnosis not present

## 2019-08-06 DIAGNOSIS — I10 Essential (primary) hypertension: Secondary | ICD-10-CM | POA: Diagnosis not present

## 2019-08-06 DIAGNOSIS — R6889 Other general symptoms and signs: Secondary | ICD-10-CM | POA: Diagnosis not present

## 2019-08-06 DIAGNOSIS — K5900 Constipation, unspecified: Secondary | ICD-10-CM | POA: Diagnosis not present

## 2019-08-06 DIAGNOSIS — Z7689 Persons encountering health services in other specified circumstances: Secondary | ICD-10-CM | POA: Diagnosis not present

## 2019-08-06 DIAGNOSIS — Z20822 Contact with and (suspected) exposure to covid-19: Secondary | ICD-10-CM | POA: Diagnosis not present

## 2019-08-06 DIAGNOSIS — R531 Weakness: Secondary | ICD-10-CM | POA: Diagnosis not present

## 2019-08-06 DIAGNOSIS — N390 Urinary tract infection, site not specified: Secondary | ICD-10-CM | POA: Diagnosis not present

## 2019-08-06 DIAGNOSIS — R109 Unspecified abdominal pain: Secondary | ICD-10-CM | POA: Diagnosis not present

## 2019-08-13 DIAGNOSIS — K219 Gastro-esophageal reflux disease without esophagitis: Secondary | ICD-10-CM | POA: Diagnosis not present

## 2019-08-13 DIAGNOSIS — K582 Mixed irritable bowel syndrome: Secondary | ICD-10-CM | POA: Diagnosis not present

## 2019-08-30 ENCOUNTER — Other Ambulatory Visit: Payer: Self-pay | Admitting: Family

## 2019-09-09 DIAGNOSIS — I739 Peripheral vascular disease, unspecified: Secondary | ICD-10-CM | POA: Diagnosis not present

## 2019-09-09 DIAGNOSIS — M71571 Other bursitis, not elsewhere classified, right ankle and foot: Secondary | ICD-10-CM | POA: Diagnosis not present

## 2019-09-09 DIAGNOSIS — L84 Corns and callosities: Secondary | ICD-10-CM | POA: Diagnosis not present

## 2019-09-09 DIAGNOSIS — M79671 Pain in right foot: Secondary | ICD-10-CM | POA: Diagnosis not present

## 2019-09-09 DIAGNOSIS — B351 Tinea unguium: Secondary | ICD-10-CM | POA: Diagnosis not present

## 2019-10-08 DIAGNOSIS — D485 Neoplasm of uncertain behavior of skin: Secondary | ICD-10-CM | POA: Diagnosis not present

## 2019-10-08 DIAGNOSIS — L821 Other seborrheic keratosis: Secondary | ICD-10-CM | POA: Diagnosis not present

## 2019-10-08 DIAGNOSIS — D225 Melanocytic nevi of trunk: Secondary | ICD-10-CM | POA: Diagnosis not present

## 2019-10-08 DIAGNOSIS — Z7189 Other specified counseling: Secondary | ICD-10-CM | POA: Diagnosis not present

## 2019-10-08 DIAGNOSIS — L814 Other melanin hyperpigmentation: Secondary | ICD-10-CM | POA: Diagnosis not present

## 2019-11-13 ENCOUNTER — Other Ambulatory Visit: Payer: Self-pay | Admitting: Family

## 2019-11-18 DIAGNOSIS — B351 Tinea unguium: Secondary | ICD-10-CM | POA: Diagnosis not present

## 2019-11-18 DIAGNOSIS — I739 Peripheral vascular disease, unspecified: Secondary | ICD-10-CM | POA: Diagnosis not present

## 2019-11-27 DIAGNOSIS — Z79899 Other long term (current) drug therapy: Secondary | ICD-10-CM | POA: Diagnosis not present

## 2019-11-27 DIAGNOSIS — K581 Irritable bowel syndrome with constipation: Secondary | ICD-10-CM | POA: Diagnosis not present

## 2019-11-27 DIAGNOSIS — E782 Mixed hyperlipidemia: Secondary | ICD-10-CM | POA: Diagnosis not present

## 2019-11-27 DIAGNOSIS — I10 Essential (primary) hypertension: Secondary | ICD-10-CM | POA: Diagnosis not present

## 2019-11-27 DIAGNOSIS — M2550 Pain in unspecified joint: Secondary | ICD-10-CM | POA: Diagnosis not present

## 2019-12-05 ENCOUNTER — Other Ambulatory Visit: Payer: Self-pay | Admitting: Family

## 2019-12-19 DIAGNOSIS — M79675 Pain in left toe(s): Secondary | ICD-10-CM | POA: Diagnosis not present

## 2019-12-19 DIAGNOSIS — M79674 Pain in right toe(s): Secondary | ICD-10-CM | POA: Diagnosis not present

## 2020-01-02 DIAGNOSIS — Z23 Encounter for immunization: Secondary | ICD-10-CM | POA: Diagnosis not present

## 2020-01-14 DIAGNOSIS — L820 Inflamed seborrheic keratosis: Secondary | ICD-10-CM | POA: Diagnosis not present

## 2020-01-14 DIAGNOSIS — R208 Other disturbances of skin sensation: Secondary | ICD-10-CM | POA: Diagnosis not present

## 2020-01-14 DIAGNOSIS — L538 Other specified erythematous conditions: Secondary | ICD-10-CM | POA: Diagnosis not present

## 2020-01-21 ENCOUNTER — Other Ambulatory Visit: Payer: Self-pay | Admitting: Family

## 2020-02-06 DIAGNOSIS — K582 Mixed irritable bowel syndrome: Secondary | ICD-10-CM | POA: Diagnosis not present

## 2020-02-06 DIAGNOSIS — K219 Gastro-esophageal reflux disease without esophagitis: Secondary | ICD-10-CM | POA: Diagnosis not present

## 2020-02-06 DIAGNOSIS — R140 Abdominal distension (gaseous): Secondary | ICD-10-CM | POA: Diagnosis not present

## 2020-02-21 DIAGNOSIS — Z23 Encounter for immunization: Secondary | ICD-10-CM | POA: Diagnosis not present

## 2020-05-11 ENCOUNTER — Other Ambulatory Visit: Payer: Self-pay | Admitting: Family

## 2021-03-31 ENCOUNTER — Other Ambulatory Visit: Payer: Self-pay | Admitting: Family

## 2021-05-22 ENCOUNTER — Other Ambulatory Visit: Payer: Self-pay | Admitting: Family

## 2021-08-20 ENCOUNTER — Other Ambulatory Visit: Payer: Self-pay | Admitting: Family Medicine
